# Patient Record
Sex: Female | Born: 1985 | Race: White | Hispanic: No | Marital: Married | Smoking: Never smoker
Health system: Southern US, Community
[De-identification: ages and names within clinical notes are randomized; demographics above are authoritative.]

## PROBLEM LIST (undated history)

## (undated) ENCOUNTER — Emergency Department: Admission: EM | Payer: Medicaid Other | Source: Home / Self Care

## (undated) DIAGNOSIS — D126 Benign neoplasm of colon, unspecified: Secondary | ICD-10-CM

## (undated) DIAGNOSIS — E039 Hypothyroidism, unspecified: Secondary | ICD-10-CM

## (undated) DIAGNOSIS — D1391 Familial adenomatous polyposis: Secondary | ICD-10-CM

## (undated) HISTORY — PX: OTHER SURGICAL HISTORY: SHX169

---
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
## 2008-07-01 HISTORY — PX: MANDIBLE FRACTURE SURGERY: SHX706

## 2009-07-01 HISTORY — PX: TOTAL THYROIDECTOMY: SHX2547

## 2012-07-01 HISTORY — PX: OVARIAN CYST REMOVAL: SHX89

## 2013-07-01 HISTORY — PX: ECTOPIC PREGNANCY SURGERY: SHX613

## 2016-10-27 ENCOUNTER — Observation Stay
Admission: EM | Admit: 2016-10-27 | Discharge: 2016-10-27 | Disposition: A | Discharge status: Home / Self Care | Payer: Medicaid Other | Attending: Obstetrics & Gynecology | Admitting: Obstetrics & Gynecology

## 2016-10-27 ENCOUNTER — Observation Stay: Payer: Medicaid Other

## 2016-10-27 DIAGNOSIS — O26893 Other specified pregnancy related conditions, third trimester: Principal | ICD-10-CM | POA: Insufficient documentation

## 2016-10-27 DIAGNOSIS — O99283 Endocrine, nutritional and metabolic diseases complicating pregnancy, third trimester: Secondary | ICD-10-CM | POA: Insufficient documentation

## 2016-10-27 DIAGNOSIS — Z79899 Other long term (current) drug therapy: Secondary | ICD-10-CM | POA: Insufficient documentation

## 2016-10-27 DIAGNOSIS — Z3A31 31 weeks gestation of pregnancy: Secondary | ICD-10-CM | POA: Insufficient documentation

## 2016-10-27 DIAGNOSIS — E039 Hypothyroidism, unspecified: Secondary | ICD-10-CM | POA: Insufficient documentation

## 2016-10-27 DIAGNOSIS — R112 Nausea with vomiting, unspecified: Secondary | ICD-10-CM | POA: Insufficient documentation

## 2016-10-27 DIAGNOSIS — R1013 Epigastric pain: Secondary | ICD-10-CM | POA: Insufficient documentation

## 2016-10-27 DIAGNOSIS — R109 Unspecified abdominal pain: Secondary | ICD-10-CM | POA: Diagnosis present

## 2016-10-27 DIAGNOSIS — Z3490 Encounter for supervision of normal pregnancy, unspecified, unspecified trimester: Secondary | ICD-10-CM

## 2016-10-27 HISTORY — DX: Familial adenomatous polyposis: D13.91

## 2016-10-27 HISTORY — DX: Benign neoplasm of colon, unspecified: D12.6

## 2016-10-27 HISTORY — DX: Hypothyroidism, unspecified: E03.9

## 2016-10-27 LAB — COMPREHENSIVE METABOLIC PANEL
ALT: 17 U/L (ref 14–54)
ANION GAP: 7 (ref 5–15)
AST: 22 U/L (ref 15–41)
Albumin: 3 g/dL — ABNORMAL LOW (ref 3.5–5.0)
Alkaline Phosphatase: 80 U/L (ref 38–126)
BUN: 8 mg/dL (ref 6–20)
CALCIUM: 7.9 mg/dL — AB (ref 8.9–10.3)
CHLORIDE: 106 mmol/L (ref 101–111)
CO2: 25 mmol/L (ref 22–32)
Creatinine, Ser: 0.5 mg/dL (ref 0.44–1.00)
GFR calc non Af Amer: >60 mL/min (ref >60)
Glucose, Bld: 91 mg/dL (ref 65–99)
Potassium: 3.6 mmol/L (ref 3.5–5.1)
SODIUM: 138 mmol/L (ref 135–145)
Total Bilirubin: 0.4 mg/dL (ref 0.3–1.2)
Total Protein: 6 g/dL — ABNORMAL LOW (ref 6.5–8.1)

## 2016-10-27 LAB — CBC
HCT: 34.8 % — ABNORMAL LOW (ref 35.0–47.0)
Hemoglobin: 12.5 g/dL (ref 12.0–16.0)
MCH: 32.4 pg (ref 26.0–34.0)
MCHC: 35.8 g/dL (ref 32.0–36.0)
MCV: 90.5 fL (ref 80.0–100.0)
PLATELETS: 167 10*3/uL (ref 150–440)
RBC: 3.85 MIL/uL (ref 3.80–5.20)
RDW: 12.2 % (ref 11.5–14.5)
WBC: 10.6 10*3/uL (ref 3.6–11.0)

## 2016-10-27 LAB — LIPASE, BLOOD: Lipase: 22 U/L (ref 11–51)

## 2016-10-27 MED ORDER — DIPHENHYDRAMINE HCL 25 MG PO CAPS
25.0000 mg | ORAL_CAPSULE | Freq: Once | ORAL | Status: AC | PRN
  Administered 2016-10-27: 25 mg via ORAL
  Filled 2016-10-27: qty 1

## 2016-10-27 MED ORDER — LACTATED RINGERS IV SOLN
INTRAVENOUS | Status: DC
  Administered 2016-10-27: 06:00:00 via INTRAVENOUS

## 2016-10-27 MED ORDER — PROMETHAZINE HCL 25 MG/ML IJ SOLN: 25.0000 mg | Freq: Four times a day (QID) | INTRAMUSCULAR | Status: DC | PRN

## 2016-10-27 MED ORDER — ONDANSETRON HCL 4 MG/2ML IJ SOLN: 4.0000 mg | INTRAMUSCULAR | Status: DC | PRN

## 2016-10-27 MED ORDER — LACTATED RINGERS IV BOLUS (SEPSIS)
500.0000 mL | Freq: Once | INTRAVENOUS | Status: AC
  Administered 2016-10-27: 500 mL via INTRAVENOUS

## 2016-10-27 NOTE — Progress Notes (Signed)
Patient symptoms have resolved after benadryl and fluids, order to d/c home if able to keep down po fluid and medication. Patient states she is no longer nauseated and is feeling much better. Discharge instructions provided and gone over with husband and patient, verbalized understanding.

## 2016-10-27 NOTE — OB Triage Note (Signed)
Patient arrived in triage with c/o waiking up around 0200 with n/v, sharp abdominal pains, and constant lower back pain.  Patient states the abdominal pain does not feel like contractions, it is constant and sharp. Has had 3 episodes of emesis today. Denies diarrhea or any urinary symptoms. States positive fetal movement, denies leaking of fluid and vaginal bleeding.  Patient receives prenatal care at [facility], but was visiting with family closer to [facility].  States no other problems this pregnancy.  Abdomen palpates soft with noticeable tenderness to palpation of mid, upper abdomen. EFM applied. Discussed plan of care with patient and significant other who verbalized understanding.

## 2016-10-27 NOTE — Discharge Instructions (Signed)
Return to hospital or follow up with primary care provider if symptoms return/worsen.  May take tums as per package instructions along with prevacid for heartburn/reflux.

## 2016-11-01 NOTE — Discharge Summary (Signed)
Pamela Griffin is a 31 y.o. female. She is at [redacted]w[redacted]d gestation. Patient's last menstrual period was 03/29/2016 (exact date). Estimated Date of Delivery: 01/03/17  Prenatal care site: [facility]  Chief complaint: abdominal pain  Location:  abdomen and back Onset/timing: sudden  Duration: an hour Quality: sharp intermittent pains Severity: 8/10 Aggravating or alleviating conditions: benadryl Associated signs/symptoms: +nausea +vomiting, benadryl helps with N/V typically.  no CTX, no VB.no LOF,  Active fetal movement. Context: Pamela Griffin usually gets her care at [facility] Has pregnancy issues of hypothyroid, notable N/V with alleviation with H2 blockers.  She woke suddenlyt this AM and had some emesis.  No recent illnesses, no sick contacts.  No blood.    Maternal Medical History:   Past Medical History:  Diagnosis Date  . FAP (familial adenomatous polyposis)   . Hypothyroidism     Past Surgical History:  Procedure Laterality Date  . ECTOPIC PREGNANCY SURGERY  2015  . HAND SURGERY  2003  . MANDIBLE FRACTURE SURGERY  2010  . OVARIAN CYST REMOVAL  2014  . TOTAL THYROIDECTOMY  2011    No Known Allergies  Prior to Admission medications   Medication Sig Start Date End Date Taking? Authorizing Provider  levothyroxine (SYNTHROID, LEVOTHROID) 100 MCG tablet Take 100 mcg by mouth daily before breakfast.   Yes Historical Provider, MD  Prenatal Vit-Fe Fumarate-FA (PRENATAL MULTIVITAMIN) TABS tablet Take 1 tablet by mouth daily at 12 noon.   Yes Historical Provider, MD     Social History: She  reports that she has never smoked. She has never used smokeless tobacco. She reports that she does not drink alcohol or use drugs.  Family History: familial adenomatous polyposis, Gardner syndrome  Review of Systems: A full review of systems was performed and negative except as noted in the HPI.     O:  BP (!) 101/47 (BP Location: Right Arm)   Pulse 64   Temp 97.8 F (36.6 C) (Oral)   Resp 16   Ht 5\' 2"   (1.575 m)   Wt 65.8 kg (145 lb)   LMP 03/29/2016 (Exact Date)   SpO2 98%   BMI 26.52 kg/m  No results found for this or any previous visit (from the past 48 hour(s)).   Constitutional: NAD, AAOx3  HE/ENT: extraocular movements grossly intact, moist mucous membranes CV: RRR PULM: nl respiratory effort, CTABL     Abd: gravid, non-tender, non-distended, soft      Ext: Non-tender, Nonedmeatous   Psych: mood appropriate, speech normal Pelvic deferred  NST:  Baseline: 135 Variability: moderate Accelerations present x >2 Decelerations absent Time 4mins  Sodium 135 - 145 mmol/L 138   Potassium 3.5 - 5.1 mmol/L 3.6   Chloride 101 - 111 mmol/L 106   CO2 22 - 32 mmol/L 25   Glucose, Bld 65 - 99 mg/dL 91   BUN 6 - 20 mg/dL 8   Creatinine, Ser 0.44 - 1.00 mg/dL 0.50   Calcium 8.9 - 10.3 mg/dL 7.9    Total Protein 6.5 - 8.1 g/dL 6.0    Albumin 3.5 - 5.0 g/dL 3.0    AST 15 - 41 U/L 22   ALT 14 - 54 U/L 17   Alkaline Phosphatase 38 - 126 U/L 80   Total Bilirubin 0.3 - 1.2 mg/dL 0.4   GFR calc non Af Amer >60 mL/min >60   GFR calc Af Amer >60 mL/min >60    WBC 3.6 - 11.0 K/uL 10.6   RBC 3.80 - 5.20 MIL/uL 3.85  Hemoglobin 12.0 - 16.0 g/dL 12.5   HCT 35.0 - 47.0 % 34.8    MCV 80.0 - 100.0 fL 90.5   MCH 26.0 - 34.0 pg 32.4   MCHC 32.0 - 36.0 g/dL 35.8   RDW 11.5 - 14.5 % 12.2   Platelets 150 - 440 K/uL 167    CLINICAL DATA:  Epigastric pain with nausea and vomiting for 5 hours.  EXAM: US ABDOMEN LIMITED - RIGHT UPPER QUADRANT  COMPARISON:  None.  FINDINGS: Gallbladder:  No gallstones or wall thickening visualized. No sonographic Murphy sign noted by sonographer.  Common bile duct:  Diameter: 3 mm  Liver:  No focal lesion identified. Within normal limits in parenchymal echogenicity.  IMPRESSION: Negative RIGHT upper quadrant ultrasound.   Electronically Signed   By: Pamela Griffin M.D.   On: 10/27/2016 05:27   A/P: 31 y.o. [redacted]w[redacted]d here for  GI distress  Labor: not present.   Fetal Wellbeing: Reassuring Cat 1 tracing.  Labs unremarkable, ultrasound negative.  Requested bendadryl which provided relief.    Reactive NST   D/c home stable, precautions reviewed, follow-up as scheduled.   ----- Larey Days, MD Attending Obstetrician and Gynecologist [facility], Department of [facility]

## 2017-05-05 ENCOUNTER — Encounter (HOSPITAL_COMMUNITY): Payer: Self-pay

## 2018-09-02 IMAGING — US US ABDOMEN LIMITED
1 series · 14 of 25 positions shown · non-contrast
Comparison: None.

CLINICAL DATA: Epigastric pain with nausea and vomiting for 5
hours.

EXAM:
US ABDOMEN LIMITED - RIGHT UPPER QUADRANT

[Series 1: us abdomen limited · 0.21mm/px · 14 of 36 slices shown]
[im 1/36]
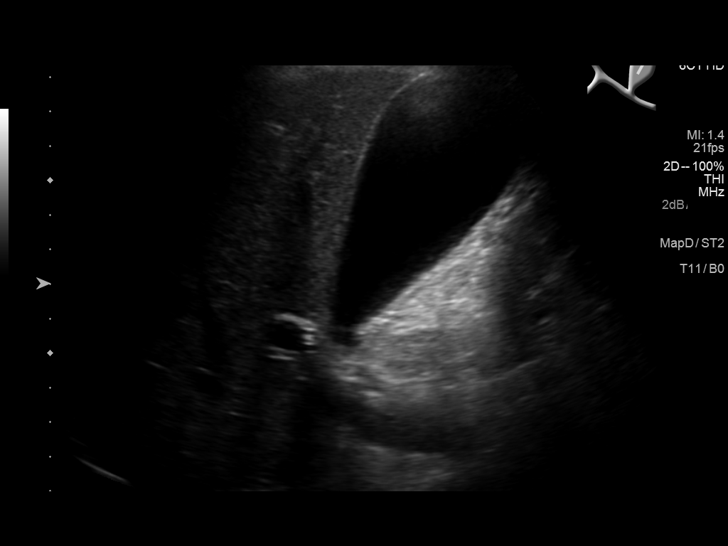
[im 3/36]
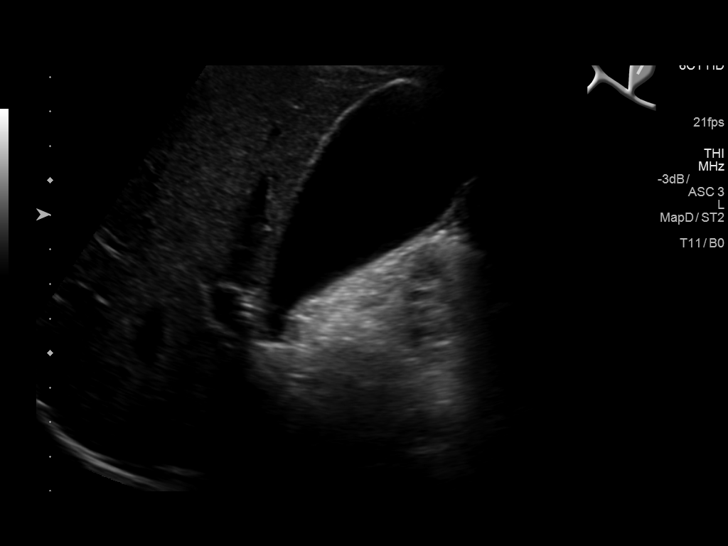
[im 6/36]
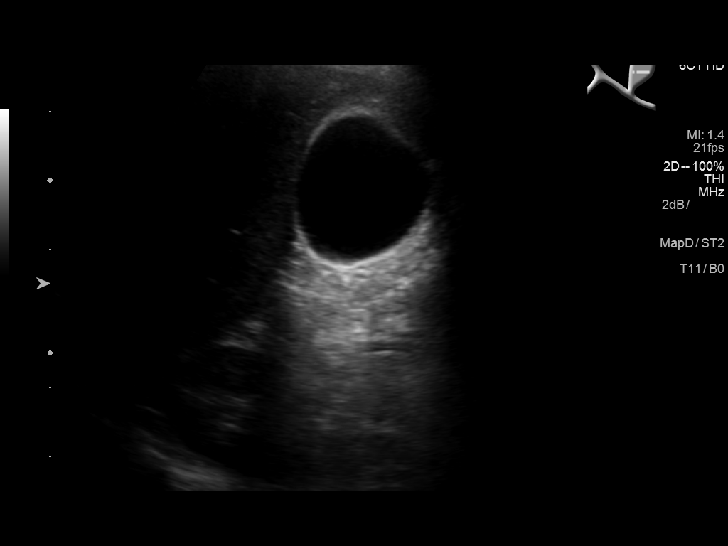
[im 9/36]
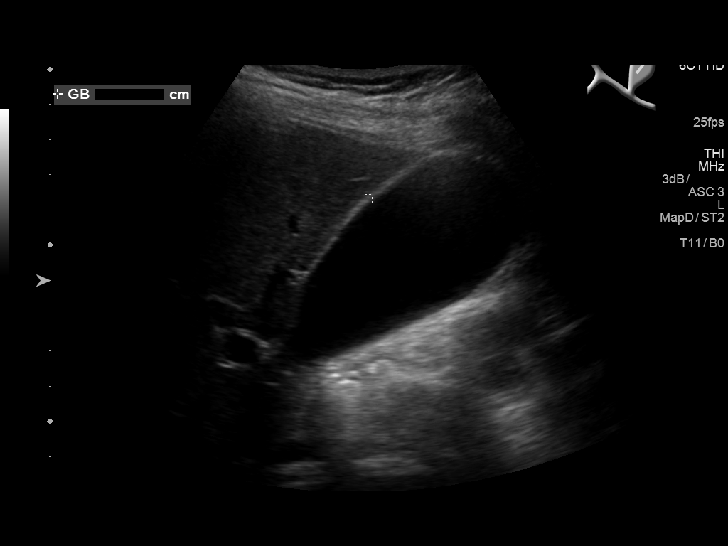
[im 12/36]
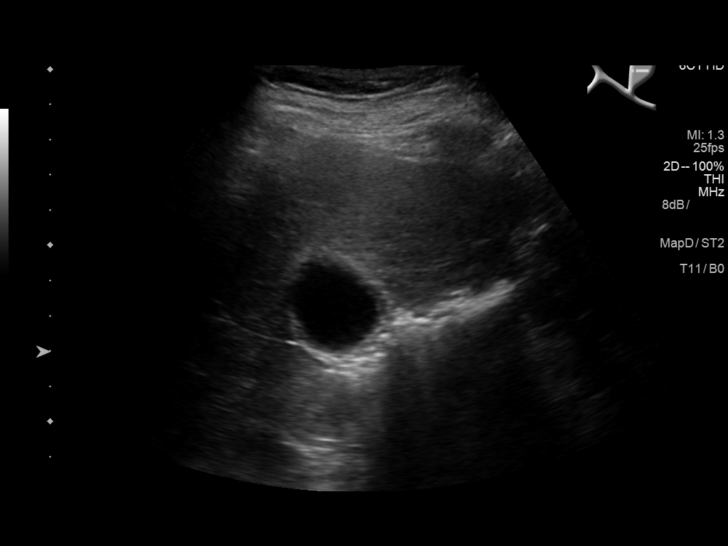
[im 14/36]
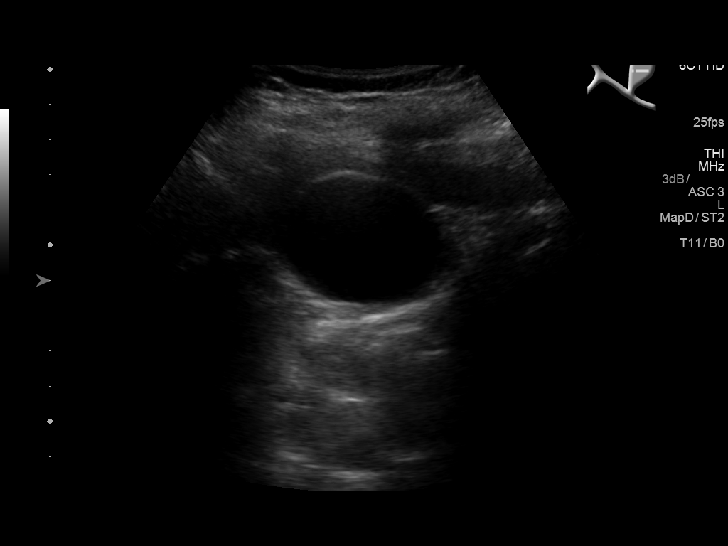
[im 17/36]
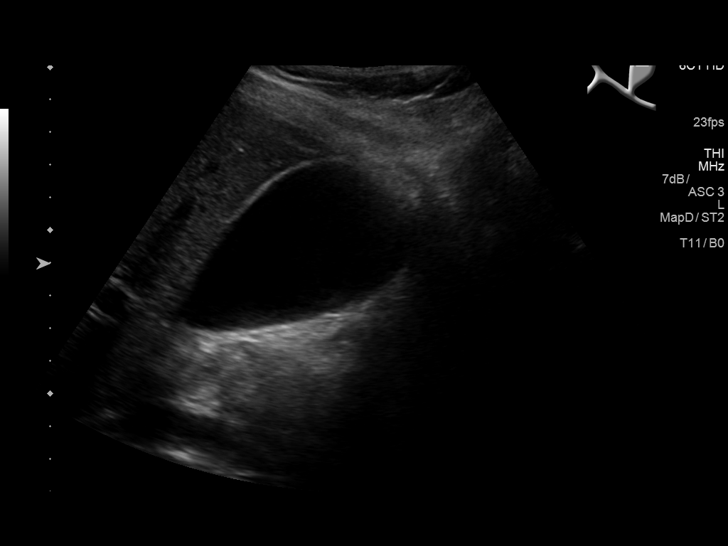
[im 19/36]
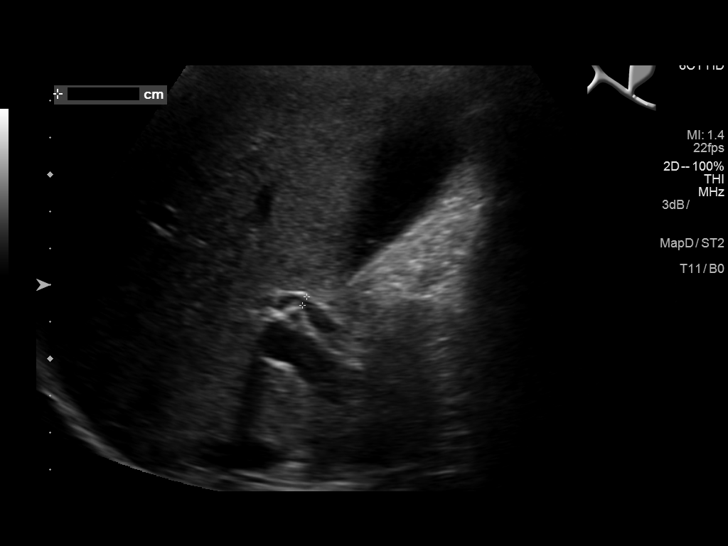
[im 22/36]
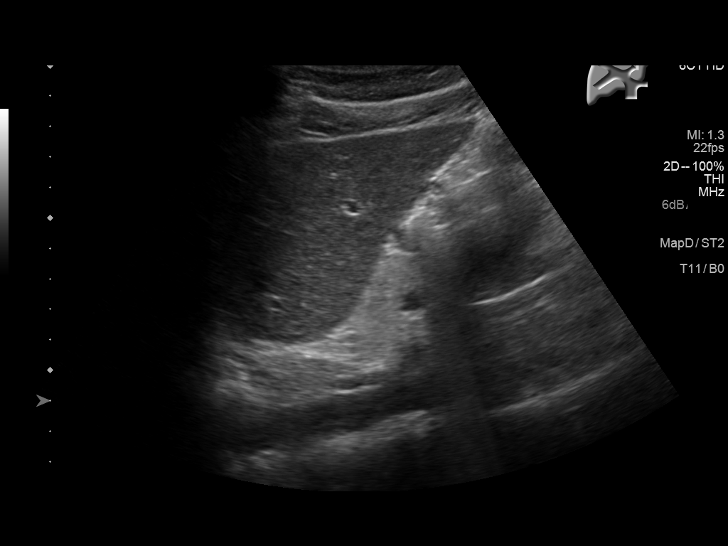
[im 24/36]
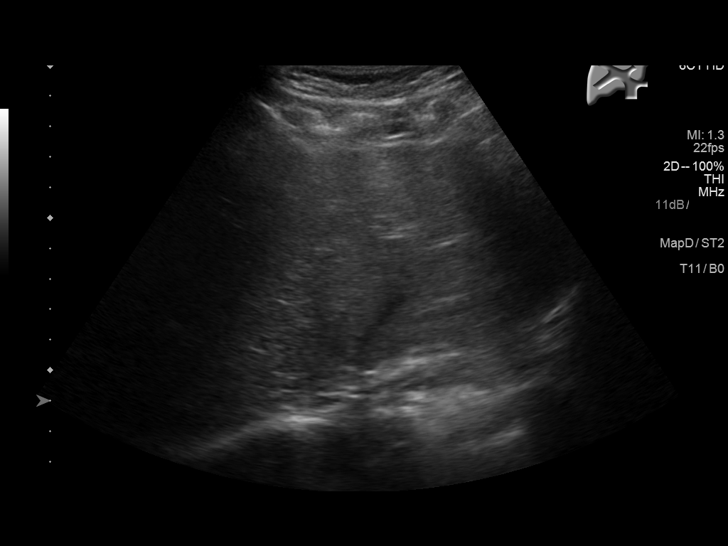
[im 27/36]
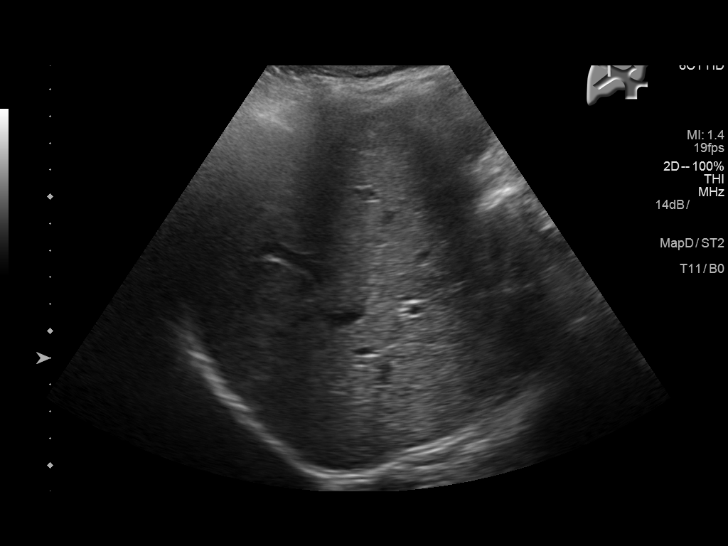
[im 30/36]
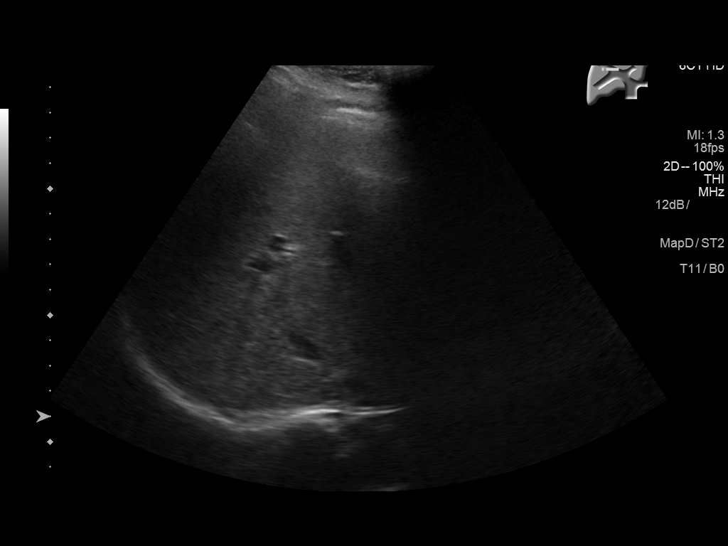
[im 33/36]
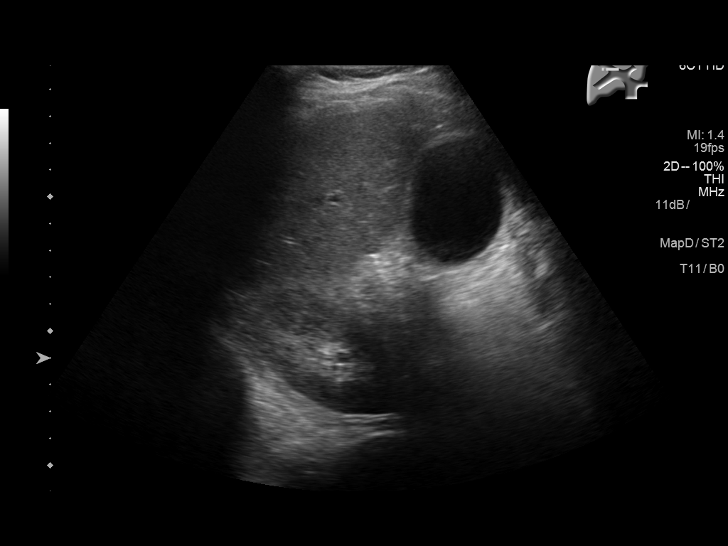
[im 36/36]
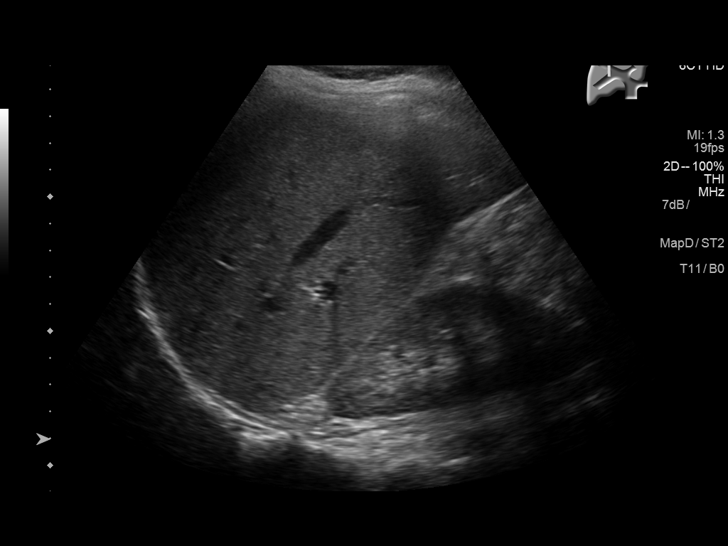

[14 of 25 positions shown; findings below may reference images not displayed]

FINDINGS: Gallbladder:

No gallstones or wall thickening visualized. No sonographic Murphy
sign noted by sonographer.

Common bile duct:

Diameter: 3 mm

Liver:

No focal lesion identified. Within normal limits in parenchymal
echogenicity.
IMPRESSION: Negative RIGHT upper quadrant ultrasound.

## 2019-05-15 ENCOUNTER — Emergency Department: Payer: Medicaid Other

## 2019-05-15 ENCOUNTER — Other Ambulatory Visit: Payer: Self-pay

## 2019-05-15 ENCOUNTER — Emergency Department
Admission: EM | Admit: 2019-05-15 | Discharge: 2019-05-16 | Disposition: A | Discharge status: Home / Self Care | Payer: Medicaid Other | Attending: Emergency Medicine | Admitting: Emergency Medicine

## 2019-05-15 DIAGNOSIS — U071 COVID-19: Secondary | ICD-10-CM | POA: Insufficient documentation

## 2019-05-15 DIAGNOSIS — E890 Postprocedural hypothyroidism: Secondary | ICD-10-CM | POA: Insufficient documentation

## 2019-05-15 DIAGNOSIS — J168 Pneumonia due to other specified infectious organisms: Secondary | ICD-10-CM | POA: Diagnosis not present

## 2019-05-15 DIAGNOSIS — R079 Chest pain, unspecified: Secondary | ICD-10-CM | POA: Insufficient documentation

## 2019-05-15 LAB — BASIC METABOLIC PANEL
Anion gap: 10 (ref 5–15)
BUN: 13 mg/dL (ref 6–20)
CO2: 25 mmol/L (ref 22–32)
Calcium: 8.4 mg/dL — ABNORMAL LOW (ref 8.9–10.3)
Chloride: 104 mmol/L (ref 98–111)
Creatinine, Ser: 0.69 mg/dL (ref 0.44–1.00)
GFR calc Af Amer: >60 mL/min (ref >60)
GFR calc non Af Amer: >60 mL/min (ref >60)
Glucose, Bld: 91 mg/dL (ref 70–99)
Potassium: 3.5 mmol/L (ref 3.5–5.1)
Sodium: 139 mmol/L (ref 135–145)

## 2019-05-15 LAB — CBC
HCT: 39.5 % (ref 36.0–46.0)
Hemoglobin: 13.2 g/dL (ref 12.0–15.0)
MCH: 29.5 pg (ref 26.0–34.0)
MCHC: 33.4 g/dL (ref 30.0–36.0)
MCV: 88.4 fL (ref 80.0–100.0)
Platelets: 206 10*3/uL (ref 150–400)
RBC: 4.47 MIL/uL (ref 3.87–5.11)
RDW: 12 % (ref 11.5–15.5)
WBC: 4.1 10*3/uL (ref 4.0–10.5)
nRBC: 0 % (ref 0.0–0.2)

## 2019-05-15 LAB — TROPONIN I (HIGH SENSITIVITY): Troponin I (High Sensitivity): 3 ng/L (ref <18)

## 2019-05-15 NOTE — ED Triage Notes (Signed)
Pt to the er for chest pain and SOB. Pt tested for covid today but no results yet. Pt says the chest pain started today. Mom tested positive for covid.

## 2019-05-16 ENCOUNTER — Emergency Department
Admission: EM | Admit: 2019-05-16 | Discharge: 2019-05-16 | Disposition: A | Discharge status: Home / Self Care | Payer: Medicaid Other | Source: Home / Self Care | Attending: Emergency Medicine | Admitting: Emergency Medicine

## 2019-05-16 ENCOUNTER — Other Ambulatory Visit: Payer: Self-pay

## 2019-05-16 ENCOUNTER — Encounter: Payer: Self-pay | Admitting: Emergency Medicine

## 2019-05-16 DIAGNOSIS — E890 Postprocedural hypothyroidism: Secondary | ICD-10-CM | POA: Insufficient documentation

## 2019-05-16 DIAGNOSIS — U071 COVID-19: Secondary | ICD-10-CM | POA: Insufficient documentation

## 2019-05-16 DIAGNOSIS — J168 Pneumonia due to other specified infectious organisms: Secondary | ICD-10-CM | POA: Insufficient documentation

## 2019-05-16 MED ORDER — HYDROCOD POLST-CPM POLST ER 10-8 MG/5ML PO SUER: 5.0000 mL | Freq: Two times a day (BID) | ORAL | 0 refills | Status: DC

## 2019-05-16 NOTE — ED Provider Notes (Signed)
[facility] Emergency Department Provider Note       Time seen: ----------------------------------------- 7:47 AM on 05/16/2019 -----------------------------------------   I have reviewed the triage vital signs and the nursing notes.  HISTORY   Chief Complaint Chest Pain, Back Pain, and Nausea    HPI Pamela Griffin is a 33 y.o. female with a history of familial adenomatous polyposis, hypothyroidism who presents to the ED for COVID-19 symptoms.  Patient's mom tested positive this past week.  She reportedly has had a negative outpatient test.  She has had some chest pain and mid back pain since 10 PM last night.  Dry cough, no fever.  Past Medical History:  Diagnosis Date  . FAP (familial adenomatous polyposis)   . Hypothyroidism     Patient Active Problem List   Diagnosis Date Noted  . Pregnancy 10/27/2016    Past Surgical History:  Procedure Laterality Date  . ECTOPIC PREGNANCY SURGERY  2015  . HAND SURGERY  2003  . MANDIBLE FRACTURE SURGERY  2010  . OVARIAN CYST REMOVAL  2014  . TOTAL THYROIDECTOMY  2011    Allergies Patient has no known allergies.  Social History Social History   Tobacco Use  . Smoking status: Never Smoker  . Smokeless tobacco: Never Used  Substance Use Topics  . Alcohol use: No  . Drug use: No    Review of Systems Constitutional: Negative for fever. Cardiovascular: Negative for chest pain. Respiratory: Negative for shortness of breath.  Positive for cough Gastrointestinal: Negative for abdominal pain, positive for nausea Musculoskeletal: Positive for back pain Skin: Negative for rash. Neurological: Negative for headaches, focal weakness or numbness.  All systems negative/normal/unremarkable except as stated in the HPI  ____________________________________________   PHYSICAL EXAM:  VITAL SIGNS: ED Triage Vitals  Enc Vitals Group     BP 05/16/19 0324 118/79     Pulse Rate 05/16/19 0324 91     Resp  05/16/19 0324 17     Temp 05/16/19 0324 98.1 F (36.7 C)     Temp Source 05/16/19 0324 Oral     SpO2 05/16/19 0324 100 %     Weight 05/16/19 0319 134 lb 14.7 oz (61.2 kg)     Height 05/16/19 0319 5\' 2"  (1.575 m)     Head Circumference --      Peak Flow --      Pain Score 05/16/19 0318 7     Pain Loc --      Pain Edu? --      Excl. in Chloride? --    Constitutional: Alert and oriented. Well appearing and in no distress. Eyes: Conjunctivae are normal. Normal extraocular movements. Cardiovascular: Normal rate, regular rhythm. No murmurs, rubs, or gallops. Respiratory: Normal respiratory effort without tachypnea nor retractions. Breath sounds are clear and equal bilaterally. No wheezes/rales/rhonchi. Gastrointestinal: Soft and nontender. Normal bowel sounds Musculoskeletal: Nontender with normal range of motion in extremities. No lower extremity tenderness nor edema. Neurologic:  Normal speech and language. No gross focal neurologic deficits are appreciated.  Skin:  Skin is warm, dry and intact. No rash noted. Psychiatric: Mood and affect are normal. Speech and behavior are normal.  ____________________________________________  EKG: Interpreted by me.  Sinus rhythm rate of 79 bpm, normal PR interval, normal QRS, normal QT  ____________________________________________  ED COURSE:  As part of my medical decision making, I reviewed the following data within the [place] History obtained from family if available, nursing notes, old chart and ekg, as  well as notes from prior ED visits. Patient presented for COVID-19 symptoms, we will assess with labs and imaging as indicated at this time.   Procedures  Luria Sondergaard was evaluated in Emergency Department on 05/16/2019 for the symptoms described in the history of present illness. She was evaluated in the context of the global COVID-19 pandemic, which necessitated consideration that the patient might be at risk for infection with  the SARS-CoV-2 virus that causes COVID-19. Institutional protocols and algorithms that pertain to the evaluation of patients at risk for COVID-19 are in a state of rapid change based on information released by regulatory bodies including the CDC and federal and state organizations. These policies and algorithms were followed during the patient's care in the ED.  ____________________________________________   LABS (pertinent positives/negatives)  Labs yesterday were unremarkable  RADIOLOGY Images were viewed by me  Chest x-ray was reassuring  ____________________________________________   DIFFERENTIAL DIAGNOSIS   COVID-19, viral infection, pneumonia  FINAL ASSESSMENT AND PLAN  COVID-19   Plan: The patient had presented for symptoms of COVID-19.  Patient has mild symptoms at this time and I think will do very well.  She will be prescribed cough medicine.  She is cleared for outpatient follow-up.   Laurence Aly, MD    Note: This note was generated in part or whole with voice recognition software. Voice recognition is usually quite accurate but there are transcription errors that can and very often do occur. I apologize for any typographical errors that were not detected and corrected.     Earleen Newport, MD 05/16/19 [phone]

## 2019-05-16 NOTE — ED Notes (Signed)
Pt waiting patiently for treatment room

## 2019-05-16 NOTE — ED Triage Notes (Addendum)
Pt here earlier in the ED waiting room and left without being seen; thought she was feeling better but alas, she is not; pt reports chest pain and mid left back pain since 10pm last night; pt's mother has tested positive for COVID and pt has been exposed; dry cough; denies fever; some shortness of breath; pt talking in complete coherent sentences; pt says she lost her sense of taste today;

## 2019-05-16 NOTE — ED Notes (Signed)
Pt c/o of SHOB and chest pain with nausea and lightheaded- no fever or diarrhea- pt's mom tested positive for COVID on Friday

## 2020-08-11 IMAGING — CR DG CHEST 2V
1 series · 2 of 2 positions shown · non-contrast
Comparison: None.

CLINICAL DATA: Chest pain

EXAM:
CHEST - 2 VIEW

[Series 1: dg chest 2 view · 0.14mm/px · 2 of 2 slices shown]
[im 1/2]
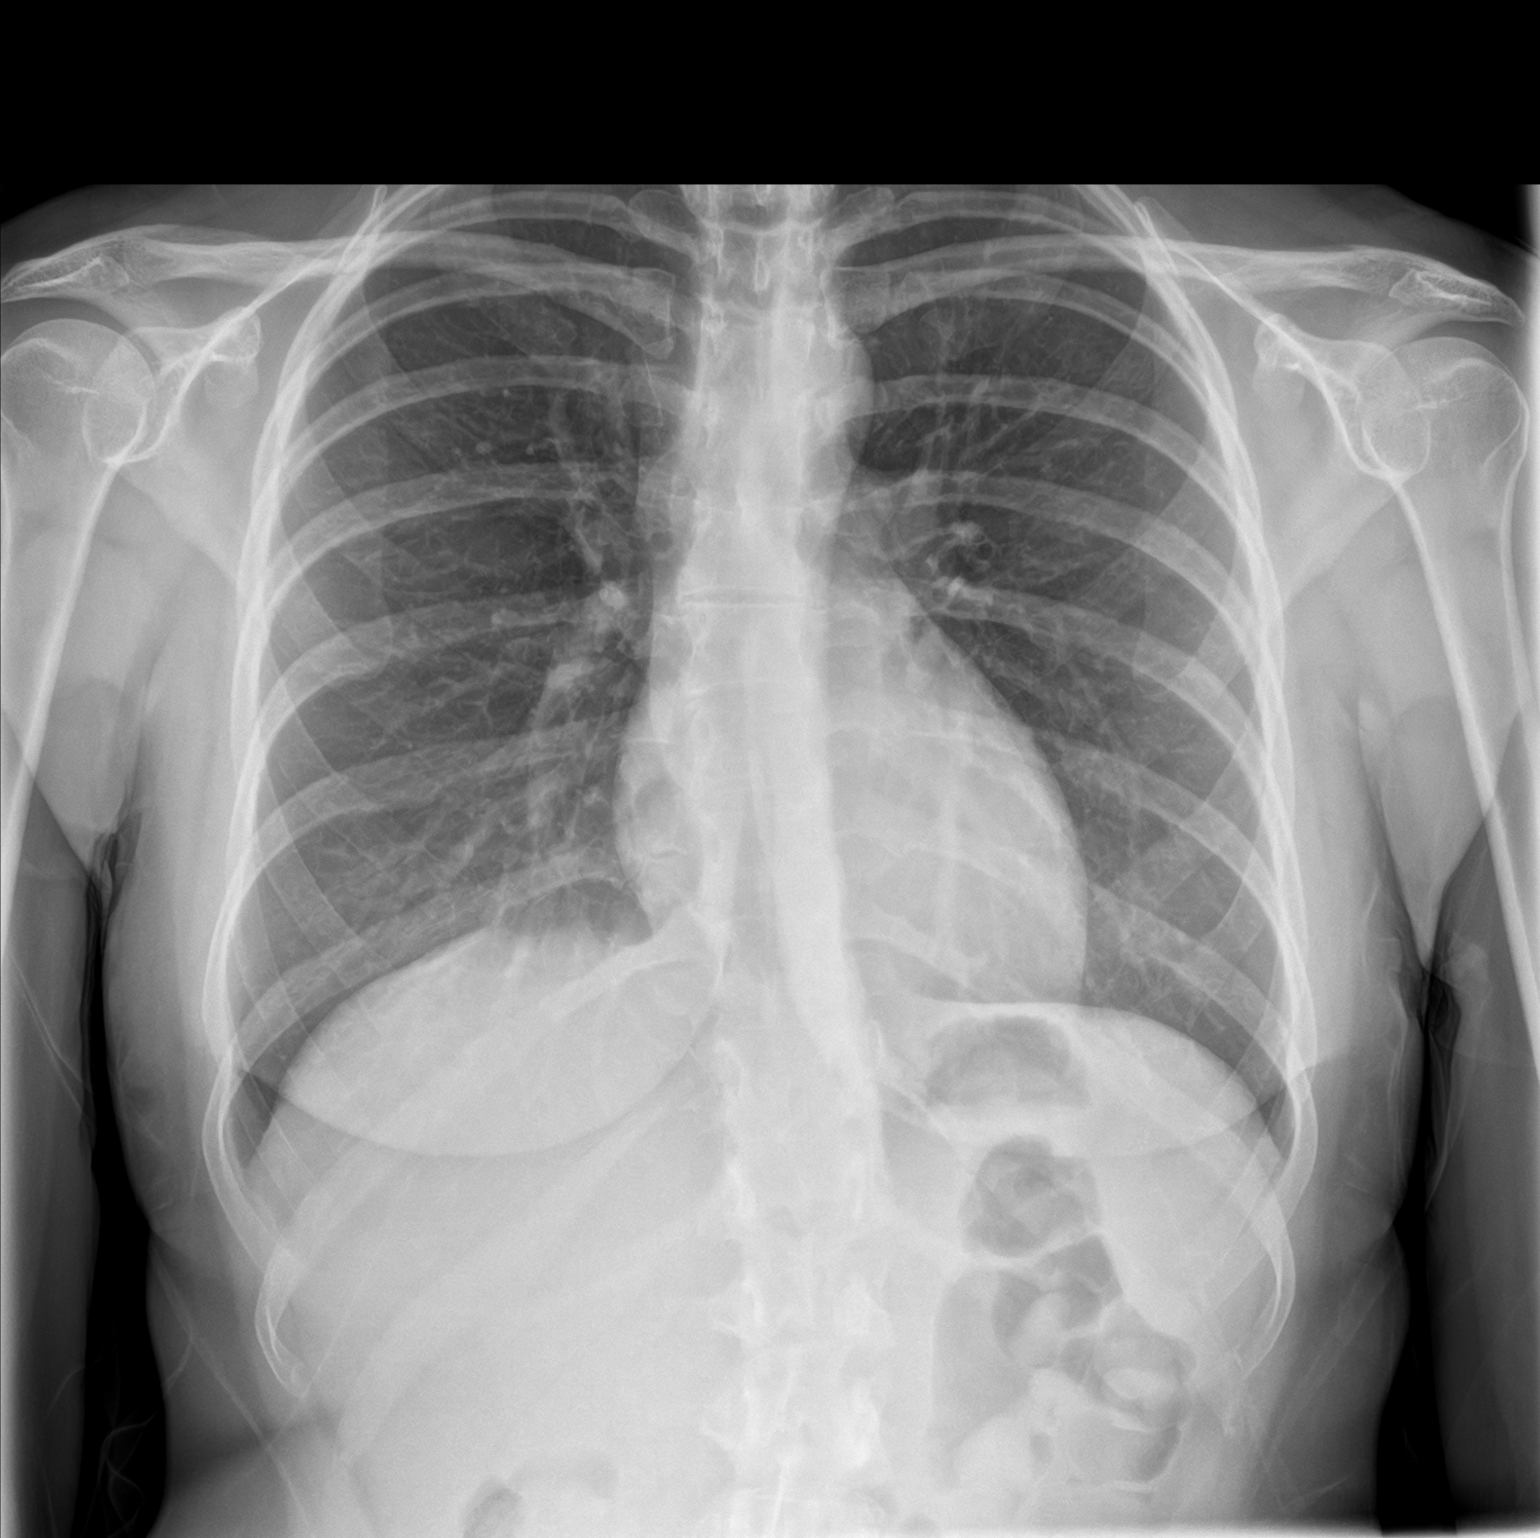
[im 2/2]
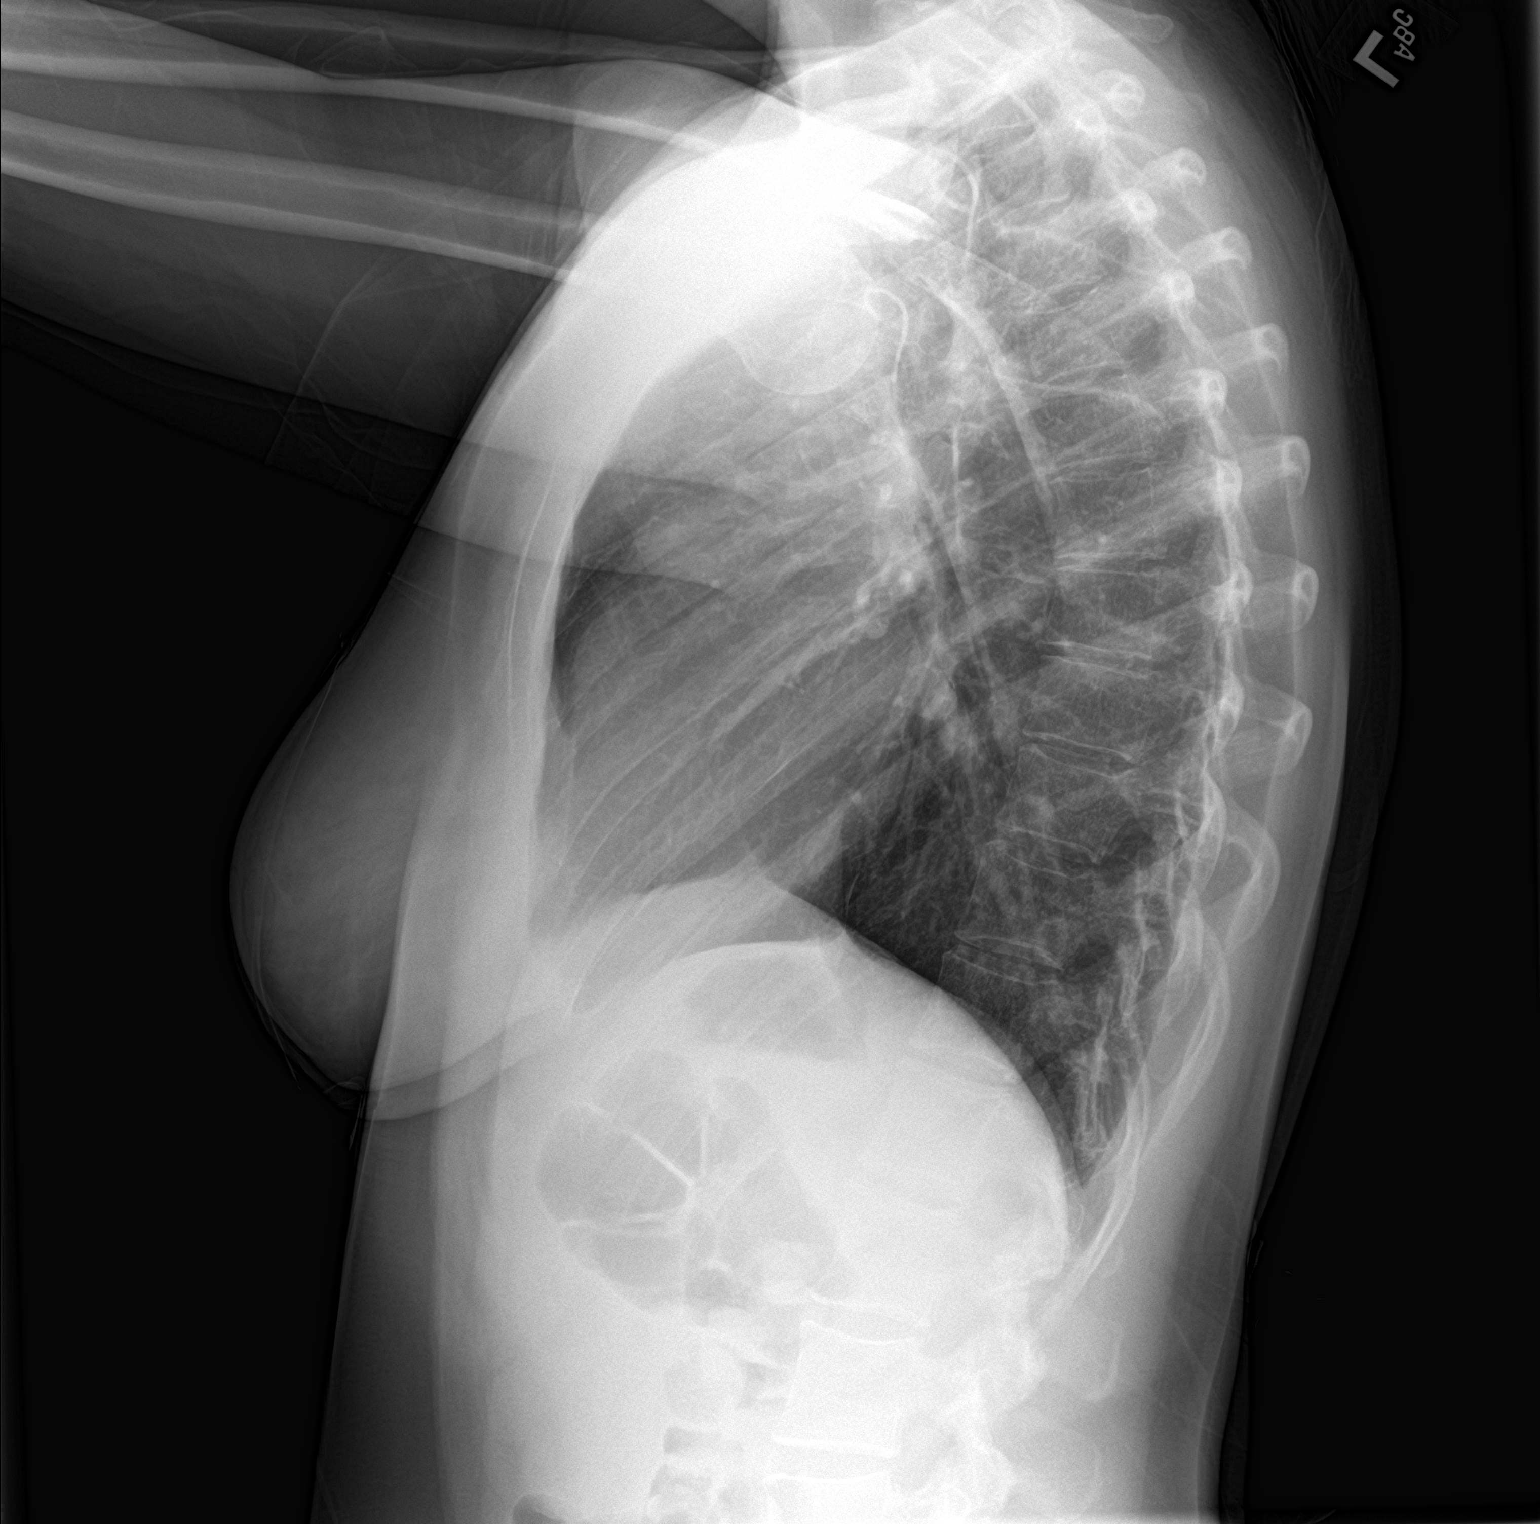

[2 of 2 positions shown; findings below may reference images not displayed]

FINDINGS: The heart size and mediastinal contours are within normal limits.
Both lungs are clear. Mild scoliosis.
IMPRESSION: No active cardiopulmonary disease.

## 2020-12-13 ENCOUNTER — Ambulatory Visit: Payer: Medicaid Other | Attending: Gerontology | Admitting: Physical Therapy

## 2020-12-13 ENCOUNTER — Other Ambulatory Visit: Payer: Self-pay

## 2020-12-13 ENCOUNTER — Encounter: Payer: Self-pay | Admitting: Physical Therapy

## 2020-12-13 DIAGNOSIS — R278 Other lack of coordination: Secondary | ICD-10-CM | POA: Diagnosis present

## 2020-12-13 DIAGNOSIS — M6281 Muscle weakness (generalized): Secondary | ICD-10-CM | POA: Insufficient documentation

## 2020-12-13 NOTE — Therapy (Signed)
[facility] [address] [place], [place], [address] Phone: [phone]   Fax:  [phone]  Physical Therapy Evaluation  Patient Details  Name: Pamela Griffin MRN: [id number] Date of Birth: 10/28/1985 Referring Provider (PT): Wynelle Cleveland   Encounter Date: 12/13/2020   PT End of Session - 12/13/20 0940     Visit Number 1    Number of Visits 12    Date for PT Re-Evaluation 03/07/21    Authorization Type IE 12/13/2020    PT Start Time 0930    PT Stop Time 1010    PT Time Calculation (min) 40 min    Activity Tolerance Patient tolerated treatment well    Behavior During Therapy [facility] for tasks assessed/performed             Past Medical History:  Diagnosis Date   FAP (familial adenomatous polyposis)    Hypothyroidism     Past Surgical History:  Procedure Laterality Date   ECTOPIC PREGNANCY SURGERY  2015   HAND SURGERY  2003   MANDIBLE FRACTURE SURGERY  2010   OVARIAN CYST REMOVAL  2014   TOTAL THYROIDECTOMY  2011    There were no vitals filed for this visit.        [facility] PT Assessment - 12/13/20 0001       Assessment   Medical Diagnosis SUI    Referring Provider (PT) Wynelle Cleveland    Hand Dominance Right    Prior Therapy None for this dx      Balance Screen   Has the patient fallen in the past 6 months No             PELVIC HEALTH PHYSICAL THERAPY EVALUATION  SCREENING Red Flags: None Have you had any night sweats? Unexplained weight loss? Saddle anesthesia? Unexplained changes in bowel or bladder habits?  Precautions: None  SUBJECTIVE  Chief Complaint: Patient notes that she has UI with coughing/laughing/sneezing, yelling, jumping, impact exercises. Patient reports that this complaint has been going on since roughly 2014; but she notes that she does recollect having some issues with UI in high school.  Pertinent History:  Falls Negative.  Scoliosis Positive for continued  screening in adolescence. Pulmonary disease/dysfunction Negative. Surgical history: Positive for see above.   Recent Procedures/Tests/Findings: 07/06/2020 urology: UA neg PvR 12 mls  Obstetrical History: G5P3 Deliveries: vaginal Tearing/Episiotomy: P1, grade 3.  Birthing position: back  Gynecological History: Hysterectomy: No  Endometriosis: Negative Pelvic Organ Prolapse: Negative Last Menstrual Period:  Pain with exam: No Heaviness/pressure: No    Urinary History: Incontinence: Positive. Onset: 2014 Triggers: impact exercise, yelling, coughing, laughing, sneezing. Amount: Min/Mod. Protective undergarments: No   Fluid Intake: 1 gal H20, occasional caffeinated, 1x/week diet sodas Nocturia: 0x/night Frequency of urination: every 1 hour Toileting posture: tucked feet Pain with urination: Negative Difficulty initiating urination: Negative Intermittent stream: Negative Frequent UTI: Negative.   Sexual activity/pain: Pain with intercourse: Negative.   Initial penetration: No  Deep thrusting: No External stimulation: No  Location of pain: n/a  Current activities:  Mom; wedding cakes; exercise  OBJECTIVE  Mental Status Patient is oriented to person, place and time.  Recent memory is intact.  Remote memory is intact.  Attention span and concentration are intact.  Expressive speech is intact.  Patient's fund of knowledge is within normal limits for educational level.  POSTURE/OBSERVATIONS:  Lumbar lordosis: WNL Thoracic kyphosis: WNL Iliac crest height: equal bilaterally Lumbar lateral shift: negative Pelvic obliquity: negative Leg length discrepancy:  negative  GAIT: No gross deviations; WFL.  RANGE OF MOTION: deferred 2/2 to time constraints   LEFT RIGHT  Lumbar forward flexion (65):      Lumbar extension (30):     Lumbar lateral flexion (25):     Thoracic and Lumbar rotation (30 degrees):       Hip Flexion (0-125):      Hip IR (0-45):     Hip ER (0-45):      Hip Abduction (0-40):     Hip extension (0-15):       SENSATION: deferred 2/2 to time constraints  STRENGTH: MMT deferred 2/2 to time constraints  RLE LLE  Hip Flexion    Hip Extension    Hip Abduction     Hip Adduction     Hip ER     Hip IR     Knee Extension    Knee Flexion    Dorsiflexion     Plantarflexion (seated)     ABDOMINAL: deferred 2/2 to time constraints Palpation: Diastasis: Scar mobility: Rib flare:  SPECIAL TESTS: deferred 2/2 to time constraints   PHYSICAL PERFORMANCE MEASURES: STS: WNL   EXTERNAL PELVIC EXAM: deferred 2/2 to time constraints Palpation: Breath coordination: Voluntary Contraction: present/absent Relaxation: full/delayed/non-relaxing Perineal movement with sustained IAP increase ("bear down"): descent/no change/elevation/excessive descent Perineal movement with rapid IAP increase ("cough"): elevation/no change/descent  INTERNAL VAGINAL EXAM: deferred 2/2 to time constraints Introitus Appears:  Skin integrity:  Scar mobility: Strength (PERF):  Symmetry: Palpation: Prolapse:   INTERNAL RECTAL EXAM: not indicated Strength (PERF): Symmetry: Palpation: Prolapse:   OUTCOME MEASURES: FOTO (Urinary 55)   ASSESSMENT Patient is a 35 year old presenting to clinic with chief complaints of urinary incontinence with physical activity. Today's evaluation is suggestive of deficits in IAP management, PFM strength, and PFM coordination as evidenced by leakage with impact activities and those requiring increased intra-abdominal pressure (yelling, lifting, coughing). Patient denies any sensations of heaviness, pressure, or bulging which suggests that bladder control complaints are less likely to be associated with a significant pelvic organ support concern. Patient's responses on FOTO Urinary outcome measures (55) indicate moderate functional limitations/disability/distress. 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 Patient was able to achieve basic understanding of PFM functions and typical bladder functions during today's evaluation and responded positively to educational interventions. Patient will benefit from continued skilled therapeutic intervention to address deficits in IAP management, PFM strength, and PFM coordination in order to increase function and improve overall QOL.  EDUCATION Patient educated on prognosis, POC, and provided with HEP including: bladder diary. Patient articulated understanding and returned demonstration. Patient will benefit from further education in order to maximize compliance and understanding for long-term therapeutic gains.  TREATMENT Neuromuscular Re-education: Patient educated on primary functions of the pelvic floor including: posture/balance, sexual pleasure, storage and elimination of waste from the body, abdominal cavity closure, and breath coordination. Patient educated on extensively on typical bladder function, typical bladder habits, basics of urge suppression, bladder irritants, toileting posture, and bowel retraining in order to better regulate bowel and bladder through behavioral changes.        Objective measurements completed on examination: See above findings.                    PT Long Term Goals - 12/13/20 1629       PT LONG TERM GOAL #1   Title Patient will demonstrate independence with  HEP in order to maximize therapeutic gains and improve carryover from physical therapy sessions to ADLs in the home and community.    Baseline IE: not initiated    Time 12    Period Weeks    Status New    Target Date 03/07/21      PT LONG TERM GOAL #2   Title Patient will demonstrate circumferential and sequential contraction of >4/5 MMT, > 6 sec hold x10 and 5 consecutive quick flicks with </= 10 min rest between testing bouts, and relaxation of the PFM coordinated  with breath for improved management of intra-abdominal pressure and normal bowel and bladder function without the presence of pain nor incontinence in order to improve participation at home and in the community.    Baseline IE: assess at next visit    Time 12    Period Weeks    Status New    Target Date 03/07/21      PT LONG TERM GOAL #3   Title Patient will demonstrate improved function as evidenced by a score of 65 on FOTO measure for full participation in activities at home and in the community.    Baseline IE: 29    Time 12    Period Weeks    Status New    Target Date 03/07/21      PT LONG TERM GOAL #4   Title Patient will report "no difficulty" with participation in physical activities with respect to urinary problem for full participation in activities at home and in the community.    Baseline IE: "quite a bit of difficulty"    Time 12    Period Weeks    Status New    Target Date 03/07/21      PT LONG TERM GOAL #5   Title Patient will indicate "complete confidence" (or > 7/10) in her ability to control her bladder and prevent urinary leakage for improved QoL and participation.    Baseline IE: "moderate confidence"    Time 12    Period Weeks    Status New    Target Date 03/07/21                    Plan - 12/13/20 1608     Clinical Impression Statement Patient is a 35 year old presenting to clinic with chief complaints of urinary incontinence with physical activity. Today's evaluation is suggestive of deficits in IAP management, PFM strength, and PFM coordination as evidenced by leakage with impact activities and those requiring increased intra-abdominal pressure (yelling, lifting, coughing). Patient denies any sensations of heaviness, pressure, or bulging which suggests that bladder control complaints are less likely to be associated with a significant pelvic organ support concern. Patient's responses on FOTO Urinary outcome measures (55) indicate moderate functional  limitations/disability/distress. 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 Patient was able to achieve basic understanding of PFM functions and typical bladder functions during today's evaluation and responded positively to educational interventions. Patient will benefit from continued skilled therapeutic intervention to address deficits in IAP management, PFM strength, and PFM coordination in order to increase function and improve overall QOL.    Personal Factors and Comorbidities Behavior Pattern;Comorbidity 2;Past/Current Experience;Time since onset of injury/illness/exacerbation;Profession    Comorbidities hypothyroidism, familial adenomatous polyposis    Examination-Activity Limitations Transfers;Squat;Lift;Bend;Continence    Examination-Participation Restrictions Occupation;Cleaning;Laundry;Community Activity;Shop    Stability/Clinical Decision Making Evolving/Moderate complexity    Clinical Decision Making  Moderate    Rehab Potential Good    PT Frequency 1x / week    PT Duration 12 weeks    PT Treatment/Interventions ADLs/Self Care Home Management;Biofeedback;Cryotherapy;Electrical Stimulation;Moist Heat;Therapeutic exercise;Neuromuscular re-education;Patient/family education;Orthotic Fit/Training;Manual techniques;Scar mobilization;Taping;Joint Manipulations;Spinal Manipulations    PT Next Visit Plan physical assessment    PT Home Exercise Plan bladder diary; toileting posture    Consulted and Agree with Plan of Care Patient             Patient will benefit from skilled therapeutic intervention in order to improve the following deficits and impairments:  Improper body mechanics, Decreased activity tolerance, Decreased coordination, Decreased strength, Decreased endurance  Visit Diagnosis: Other lack of coordination  Muscle weakness (generalized)     Problem List Patient Active  Problem List   Diagnosis Date Noted   Pregnancy 10/27/2016   Myles Gip PT, DPT [phone]  12/13/2020, 4:31 PM  [facility] [address] [place], [place], [address] Phone: [phone]   Fax:  [phone]  Name: Gionni Freese MRN: [id number] Date of Birth: Feb 24, 1986

## 2020-12-19 ENCOUNTER — Ambulatory Visit: Payer: Medicaid Other | Admitting: Physical Therapy

## 2020-12-19 NOTE — Addendum Note (Signed)
Addended by: Louie Casa on: 12/19/2020 10:38 AM   Modules accepted: Orders

## 2020-12-20 ENCOUNTER — Ambulatory Visit: Payer: Medicaid Other | Admitting: Physical Therapy

## 2020-12-25 ENCOUNTER — Ambulatory Visit
Admission: EM | Admit: 2020-12-25 | Discharge: 2020-12-25 | Disposition: A | Discharge status: Home / Self Care | Payer: Medicaid Other | Attending: Family Medicine | Admitting: Family Medicine

## 2020-12-25 ENCOUNTER — Ambulatory Visit: Payer: Medicaid Other | Admitting: Physical Therapy

## 2020-12-25 ENCOUNTER — Other Ambulatory Visit: Payer: Self-pay

## 2020-12-25 ENCOUNTER — Encounter: Payer: Self-pay | Admitting: Physical Therapy

## 2020-12-25 DIAGNOSIS — L290 Pruritus ani: Secondary | ICD-10-CM

## 2020-12-25 DIAGNOSIS — R278 Other lack of coordination: Secondary | ICD-10-CM

## 2020-12-25 DIAGNOSIS — M6281 Muscle weakness (generalized): Secondary | ICD-10-CM

## 2020-12-25 MED ORDER — HYDROCORTISONE (PERIANAL) 2.5 % EX CREA: 1.0000 "application " | TOPICAL_CREAM | Freq: Two times a day (BID) | CUTANEOUS | 0 refills | Status: AC | PRN

## 2020-12-25 NOTE — Therapy (Signed)
[facility] [address] [place], [place], [address] Phone: [phone]   Fax:  [phone]  Physical Therapy Treatment  Patient Details  Name: Pamela Griffin MRN: [id number] Date of Birth: 12-09-1985 Referring Provider (PT): Wynelle Cleveland   Encounter Date: 12/25/2020   PT End of Session - 12/25/20 1417     Visit Number 2    Number of Visits 12    Date for PT Re-Evaluation 03/07/21    Authorization Type IE 12/13/2020    PT Start Time 3212    PT Stop Time 2482    PT Time Calculation (min) 55 min    Activity Tolerance Patient tolerated treatment well    Behavior During Therapy [facility] for tasks assessed/performed             Past Medical History:  Diagnosis Date   FAP (familial adenomatous polyposis)    Hypothyroidism     Past Surgical History:  Procedure Laterality Date   ECTOPIC PREGNANCY SURGERY  2015   HAND SURGERY  2003   MANDIBLE FRACTURE SURGERY  2010   OVARIAN CYST REMOVAL  2014   TOTAL THYROIDECTOMY  2011    There were no vitals filed for this visit.   Subjective Assessment - 12/25/20 1419     Subjective Patient notes that she has not had significant changes since evaluation. She did keep a bladder diary which indicates modest urge an occasional low void volume. Leakage is consistently occurring in the afternoon.    Currently in Pain? No/denies            TREATMENT  Pre-treatment assessment: ABDOMINAL:  Palpation: mild tenderness to RUQ Diastasis: 1 finger superior, 2 finger umbilicus, 2.5 finger inferior  Rib flare: none noted  Neuromuscular Re-education: Patient educated extensively on typical bladder function, typical bladder habits, and bladder in order to better regulate bladder through behavioral changes.  Supine hooklying diaphragmatic breathing with VCs and TCs for downregulation of the nervous system and improved management of IAP Supine hooklying, TrA activation with exhalation. VCs  and TCs to decrease compensatory patterns and minimize aggravation of the lumbar paraspinals.   Patient educated throughout session on appropriate technique and form using multi-modal cueing, HEP, and activity modification. Patient articulated understanding and returned demonstration.  Patient Response to interventions: Comfortable to work on abdominal rehab until next visit.  ASSESSMENT Patient presents to clinic with excellent motivation to participate in therapy. Patient demonstrates deficits in IAP management, PFM strength, and PFM coordination. Patient able to achieve TrA activation with coordinated breath and gentle compression during today's session and responded positively to educational interventions. Patient will benefit from continued skilled therapeutic intervention to address remaining deficits in IAP management, PFM strength, and PFM coordination in order to increase function and improve overall QOL.    PT Long Term Goals - 12/13/20 1629       PT LONG TERM GOAL #1   Title Patient will demonstrate independence with HEP in order to maximize therapeutic gains and improve carryover from physical therapy sessions to ADLs in the home and community.    Baseline IE: not initiated    Time 12    Period Weeks    Status New    Target Date 03/07/21      PT LONG TERM GOAL #2   Title Patient will demonstrate circumferential and sequential contraction of >4/5 MMT, > 6 sec hold x10 and 5 consecutive quick flicks with </= 10 min rest between testing bouts, and relaxation of  the PFM coordinated with breath for improved management of intra-abdominal pressure and normal bowel and bladder function without the presence of pain nor incontinence in order to improve participation at home and in the community.    Baseline IE: assess at next visit    Time 12    Period Weeks    Status New    Target Date 03/07/21      PT LONG TERM GOAL #3   Title Patient will demonstrate improved function as  evidenced by a score of 65 on FOTO measure for full participation in activities at home and in the community.    Baseline IE: 96    Time 12    Period Weeks    Status New    Target Date 03/07/21      PT LONG TERM GOAL #4   Title Patient will report "no difficulty" with participation in physical activities with respect to urinary problem for full participation in activities at home and in the community.    Baseline IE: "quite a bit of difficulty"    Time 12    Period Weeks    Status New    Target Date 03/07/21      PT LONG TERM GOAL #5   Title Patient will indicate "complete confidence" (or > 7/10) in her ability to control her bladder and prevent urinary leakage for improved QoL and participation.    Baseline IE: "moderate confidence"    Time 12    Period Weeks    Status New    Target Date 03/07/21                   Plan - 12/25/20 1418     Clinical Impression Statement Patient presents to clinic with excellent motivation to participate in therapy. Patient demonstrates deficits in IAP management, PFM strength, and PFM coordination. Patient able to achieve TrA activation with coordinated breath and gentle compression during today's session and responded positively to educational interventions. Patient will benefit from continued skilled therapeutic intervention to address remaining deficits in IAP management, PFM strength, and PFM coordination in order to increase function and improve overall QOL.    Personal Factors and Comorbidities Behavior Pattern;Comorbidity 2;Past/Current Experience;Time since onset of injury/illness/exacerbation;Profession    Comorbidities hypothyroidism, familial adenomatous polyposis    Examination-Activity Limitations Transfers;Squat;Lift;Bend;Continence    Examination-Participation Restrictions Occupation;Cleaning;Laundry;Community Activity;Shop    Stability/Clinical Decision Making Evolving/Moderate complexity    Rehab Potential Good    PT  Frequency 1x / week    PT Duration 12 weeks    PT Treatment/Interventions ADLs/Self Care Home Management;Biofeedback;Cryotherapy;Electrical Stimulation;Moist Heat;Therapeutic exercise;Neuromuscular re-education;Patient/family education;Orthotic Fit/Training;Manual techniques;Scar mobilization;Taping;Joint Manipulations;Spinal Manipulations    PT Next Visit Plan external PFM assessment; PFM training    PT Home Exercise Plan DRAM    Consulted and Agree with Plan of Care Patient             Patient will benefit from skilled therapeutic intervention in order to improve the following deficits and impairments:  Improper body mechanics, Decreased activity tolerance, Decreased coordination, Decreased strength, Decreased endurance  Visit Diagnosis: Other lack of coordination  Muscle weakness (generalized)     Problem List Patient Active Problem List   Diagnosis Date Noted   Pregnancy 10/27/2016    Myles Gip PT, DPT [phone]  12/25/2020, 3:42 PM  [facility] [address] [place], [place], [address] Phone: [phone]   Fax:  [phone]  Name: Pamela Griffin MRN: [id number] Date of Birth: June 01, 1986

## 2020-12-25 NOTE — ED Triage Notes (Signed)
Patient states that she was seen at [facility] on 06/17 and diagnosed with pinworms. States that she completed the medicine and improved and now states that itching is worse.

## 2020-12-25 NOTE — Discharge Instructions (Addendum)
Take the med on an empty stomach.  Return with stool specimen.   Take care  Dr. Lacinda Axon

## 2020-12-26 ENCOUNTER — Ambulatory Visit: Payer: Medicaid Other | Admitting: Physical Therapy

## 2020-12-26 ENCOUNTER — Other Ambulatory Visit
Admission: RE | Admit: 2020-12-26 | Discharge: 2020-12-26 | Disposition: A | Discharge status: Home / Self Care | Payer: Medicaid Other | Source: Ambulatory Visit | Attending: Family Medicine | Admitting: Family Medicine

## 2020-12-26 DIAGNOSIS — Z119 Encounter for screening for infectious and parasitic diseases, unspecified: Secondary | ICD-10-CM | POA: Diagnosis present

## 2020-12-26 NOTE — ED Provider Notes (Signed)
[facility]    CSN: [id number] Arrival date & time: 12/25/20  1523  History   Chief Complaint Chief Complaint  Patient presents with   Anal Itching    HPI 35 year old female presents with the above complaint.  Recently developed severe anal itching. Was seen at [facility] on 6/17 and was diagnosed with pinworms. Treating with Albendazole. She believes that she has seen worms in her stool. Had initial improvement in anal itching. However, it has now returned. No other associated symptoms.   Past Medical History:  Diagnosis Date   FAP (familial adenomatous polyposis)    Hypothyroidism     Patient Active Problem List   Diagnosis Date Noted   Pregnancy 10/27/2016    Past Surgical History:  Procedure Laterality Date   ECTOPIC PREGNANCY SURGERY  2015   HAND SURGERY  2003   MANDIBLE FRACTURE SURGERY  2010   OVARIAN CYST REMOVAL  2014   TOTAL THYROIDECTOMY  2011    OB History     Gravida  4   Para  1   Term  1   Preterm      AB      Living  1      SAB      IAB      Ectopic      Multiple      Live Births  1            Home Medications    Prior to Admission medications   Medication Sig Start Date End Date Taking? Authorizing Provider  hydrocortisone (ANUSOL-HC) 2.5 % rectal cream Place 1 application rectally 2 (two) times daily as needed for anal itching. 12/25/20  Yes Lucas Exline G, DO  thyroid (ARMOUR) 60 MG tablet Take 60 mg by mouth daily before breakfast.   Yes [provider]    Family History History reviewed. No pertinent family history.  Social History Social History   Tobacco Use   Smoking status: Never   Smokeless tobacco: Never  Substance Use Topics   Alcohol use: No   Drug use: No     Allergies   Patient has no known allergies.   Review of Systems Review of Systems  Constitutional: Negative.   Gastrointestinal:        Anal itching.    Physical Exam Triage Vital Signs ED Triage Vitals  Enc  Vitals Group     BP 12/25/20 1618 105/73     Pulse Rate 12/25/20 1618 67     Resp 12/25/20 1618 14     Temp 12/25/20 1618 98.2 F (36.8 C)     Temp Source 12/25/20 1618 Oral     SpO2 12/25/20 1618 99 %     Weight 12/25/20 1616 140 lb (63.5 kg)     Height 12/25/20 1616 5\' 2"  (1.575 m)     Head Circumference --      Peak Flow --      Pain Score 12/25/20 1616 8     Pain Loc --      Pain Edu? --      Excl. in [place]? --    Updated Vital Signs BP 105/73 (BP Location: Right Arm)   Pulse 67   Temp 98.2 F (36.8 C) (Oral)   Resp 14   Ht 5\' 2"  (1.575 m)   Wt 63.5 kg   SpO2 99%   BMI 25.61 kg/m   Visual Acuity Right Eye Distance:   Left Eye Distance:  Bilateral Distance:    Right Eye Near:   Left Eye Near:    Bilateral Near:     Physical Exam Constitutional:      General: She is not in acute distress.    Appearance: Normal appearance. She is not ill-appearing.  HENT:     Head: Normocephalic and atraumatic.  Cardiovascular:     Rate and Rhythm: Normal rate and regular rhythm.  Pulmonary:     Effort: Pulmonary effort is normal.     Breath sounds: Normal breath sounds.  Neurological:     Mental Status: She is alert.  Psychiatric:        Mood and Affect: Mood normal.        Behavior: Behavior normal.     UC Treatments / Results  Labs (all labs ordered are listed, but only abnormal results are displayed) Labs Reviewed - No data to display  EKG   Radiology No results found.  Procedures Procedures (including critical care time)  Medications Ordered in UC Medications - No data to display  Initial Impression / Assessment and Plan / UC Course  I have reviewed the triage vital signs and the nursing notes.  Pertinent labs & imaging results that were available during my care of the patient were reviewed by me and considered in my medical decision making (see chart for details).    35 year old female presents with anal itching. Possible pinworms. Has been treated  by another urgent care. Advised to complete treatment with Albendazole. Anusol as directed for itching. Patient to return with stool sample for O&P.  Final Clinical Impressions(s) / UC Diagnoses   Final diagnoses:  Rectal itching     Discharge Instructions      Take the med on an empty stomach.  Return with stool specimen.   Take care  Dr. Lacinda Axon    ED Prescriptions     Medication Sig Dispense Auth. Provider   hydrocortisone (ANUSOL-HC) 2.5 % rectal cream Place 1 application rectally 2 (two) times daily as needed for anal itching. 30 g Coral Spikes, DO      PDMP not reviewed this encounter.   Coral Spikes, [place] 12/26/20 2214

## 2020-12-27 ENCOUNTER — Encounter: Payer: Medicaid Other | Admitting: Physical Therapy

## 2020-12-29 LAB — O&P RESULT

## 2020-12-29 LAB — OVA + PARASITE EXAM

## 2021-01-10 ENCOUNTER — Encounter: Payer: Medicaid Other | Admitting: Physical Therapy

## 2021-01-12 ENCOUNTER — Ambulatory Visit: Payer: Medicaid Other | Admitting: Physical Therapy

## 2021-01-16 ENCOUNTER — Encounter: Payer: Self-pay | Admitting: Physical Therapy

## 2021-01-17 ENCOUNTER — Encounter: Payer: Self-pay | Admitting: Physical Therapy

## 2021-01-24 ENCOUNTER — Encounter: Payer: Self-pay | Admitting: Physical Therapy

## 2023-07-29 ENCOUNTER — Ambulatory Visit: Payer: Medicaid Other | Admitting: Physical Therapy

## 2023-08-06 ENCOUNTER — Ambulatory Visit: Payer: Medicaid Other | Admitting: Physical Therapy

## 2023-08-13 ENCOUNTER — Encounter: Payer: Self-pay | Admitting: Physical Therapy

## 2023-08-13 ENCOUNTER — Ambulatory Visit: Payer: Medicaid Other | Attending: Obstetrics and Gynecology | Admitting: Physical Therapy

## 2023-08-13 DIAGNOSIS — M533 Sacrococcygeal disorders, not elsewhere classified: Secondary | ICD-10-CM | POA: Diagnosis present

## 2023-08-13 DIAGNOSIS — R278 Other lack of coordination: Secondary | ICD-10-CM | POA: Diagnosis present

## 2023-08-13 DIAGNOSIS — R2689 Other abnormalities of gait and mobility: Secondary | ICD-10-CM | POA: Insufficient documentation

## 2023-08-13 NOTE — Therapy (Unsigned)
OUTPATIENT PHYSICAL THERAPY EVALUATION   Patient Name: Pamela Griffin MRN: [id number] DOB:[birth year], 38 y.o., female Today's Date: 08/13/2023   PT End of Session - 08/13/23 0817     Visit Number 1    Number of Visits 10    Date for PT Re-Evaluation 10/22/23    Authorization Type Medicaid    PT Start Time 0805    PT Stop Time 0845    PT Time Calculation (min) 40 min    Activity Tolerance Patient tolerated treatment well;No increased pain    Behavior During Therapy WFL for tasks assessed/performed             Past Medical History:  Diagnosis Date   FAP (familial adenomatous polyposis)    Hypothyroidism    Past Surgical History:  Procedure Laterality Date   ECTOPIC PREGNANCY SURGERY  2015   HAND SURGERY  2003   L fallopian tube removed      MANDIBLE FRACTURE SURGERY  2010   OVARIAN CYST REMOVAL Left 2014   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   PCP:   REFERRING PROVIDER: Raliegh Scarlet   REFERRING DIAG: R32 (ICD-10-CM) - Unspecified urinary incontinence  Rationale for Evaluation and Treatment Rehabilitation  THERAPY DIAG:  Other abnormalities of gait and mobility  Sacrococcygeal disorders, not elsewhere classified  ONSET DATE:   SUBJECTIVE:                                                                                                                                                                                           SUBJECTIVE STATEMENT: 1)  SUI:  laughing, coughing, yelling, working out    Denied urge incontinence  Daily BMs, denied straining, 70 % of the time Type 4 stool consistency , 30% of time Type 3  90-100 fl oz of water   2) L shoulder discomfort when increasing weight or too many reps .  Pt had shoulder injury in 2024 due to incident  in the gym with either too much weight or too many reps. Pt finished PT for 2-3 month for L shoulder pain.  NO more pain just discomfort at the head of shoulder socket.  No numbness / tingling.   Pt just to workout 6 days week, now is 3-4 week Burn [facility].  Includes sit up and crunches,  No consistent stretches . 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    OBJECTIVE IMPAIRMENTS decreased activity tolerance, decreased coordination, decreased endurance, decreased mobility, difficulty walking, decreased ROM, decreased strength, decreased safety awareness, hypomobility, increased muscle spasms, impaired flexibility, improper body mechanics, postural dysfunction, and pain. scar restrictions   ACTIVITY LIMITATIONS  self-care,   home chores, work tasks    PARTICIPATION LIMITATIONS:  community, gym activities    PERSONAL FACTORS        are also affecting patient's functional outcome.    REHAB POTENTIAL: Good   CLINICAL DECISION MAKING: Evolving/moderate complexity   EVALUATION COMPLEXITY: Moderate    PATIENT EDUCATION:    Education details: Showed pt anatomy images.  Explained muscles attachments/ connection, physiology of deep core system/ spinal- thoracic-pelvis-lower kinetic chain as they relate to pt's presentation, Sx, and past Hx. Explained what and how these areas of deficits need to be restored to balance and function    See Therapeutic activity / neuromuscular re-education section  Answered pt's questions.   Person educated: Patient Education method: Explanation, Demonstration, Tactile cues, Verbal cues, and Handouts Education comprehension: verbalized understanding, returned demonstration, verbal cues required, tactile cues required, and needs further education     PLAN: PT FREQUENCY: 1x/week   PT DURATION: 10 weeks   PLANNED INTERVENTIONS: Therapeutic exercises, Therapeutic activity, Neuromuscular re-education, Balance training, Gait training, Patient/Family education, Self Care, Joint mobilization, Spinal mobilization, Moist heat, Taping, and Manual therapy, dry needling.   PLAN FOR NEXT SESSION: See clinical impression for plan     GOALS: Goals reviewed with patient? Yes  SHORT TERM GOALS: Target date:  09/10/2023    Pt will demo IND with HEP                    Baseline: Not IND            Goal status: INITIAL   LONG TERM GOALS: Target date: 10/22/2023    1.Pt will demo proper deep core coordination without chest breathing and optimal excursion of diaphragm/pelvic floor in order to promote spinal stability and pelvic floor function  Baseline: dyscoordination Goal status: INITIAL  2.  Pt will demo proper body mechanics in against gravity tasks and ADLs  work tasks, fitness  to minimize straining pelvic floor / back    Baseline: not IND, improper form that places strain on pelvic floor  Goal status: INITIAL    3. Pt will demo increased gait speed > 1.3 m/s with reciprocal gait pattern, longer stride length  in order to ambulate safely in community and return to fitness routine  Baseline:  Goal status:  INITIAL    4.   Baseline:  Goal status: INITIAL   5.  Baseline:  Goal status: INITIAL     Mariane Masters, PT 08/13/2023, 8:29 AM

## 2023-08-13 NOTE — Patient Instructions (Signed)

## 2023-08-15 NOTE — Addendum Note (Signed)
Addended by: Mariane Masters on: 08/15/2023 09:57 AM   Modules accepted: Orders

## 2023-08-15 NOTE — Addendum Note (Signed)
Addended by: Mariane Masters on: 08/15/2023 11:10 AM   Modules accepted: Orders

## 2023-08-20 ENCOUNTER — Ambulatory Visit: Payer: Medicaid Other | Admitting: Physical Therapy

## 2023-08-22 ENCOUNTER — Ambulatory Visit: Payer: Medicaid Other | Admitting: Physical Therapy

## 2023-08-22 DIAGNOSIS — R278 Other lack of coordination: Secondary | ICD-10-CM

## 2023-08-22 DIAGNOSIS — M533 Sacrococcygeal disorders, not elsewhere classified: Secondary | ICD-10-CM

## 2023-08-22 DIAGNOSIS — R2689 Other abnormalities of gait and mobility: Secondary | ICD-10-CM

## 2023-08-22 NOTE — Therapy (Signed)
OUTPATIENT PHYSICAL THERAPY TREATMENT   Patient Name: Niomie Englert MRN: [id number] DOB:30-Jan-1986, 38 y.o., female Today's Date: 08/22/2023   PT End of Session - 08/22/23 1028     Visit Number 2    Number of Visits 10    Date for PT Re-Evaluation 10/22/23    Authorization Type Medicaid    PT Start Time 1025    PT Stop Time 1105    PT Time Calculation (min) 40 min    Activity Tolerance Patient tolerated treatment well;No increased pain    Behavior During Therapy WFL for tasks assessed/performed             Past Medical History:  Diagnosis Date   FAP (familial adenomatous polyposis)    Hypothyroidism    Past Surgical History:  Procedure Laterality Date   ECTOPIC PREGNANCY SURGERY  2015   HAND SURGERY  2003   L fallopian tube removed      MANDIBLE FRACTURE SURGERY  2010   OVARIAN CYST REMOVAL Left 2014   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   PCP:   REFERRING PROVIDER: Raliegh Scarlet   REFERRING DIAG: R32 (ICD-10-CM) - Unspecified urinary incontinence  Rationale for Evaluation and Treatment Rehabilitation  THERAPY DIAG:  No diagnosis found.  ONSET DATE:   SUBJECTIVE:       SUBJECTIVE STATEMENT  TODAY  Pt has been practicing sitting not crossing her legs. She notices she is more manly.   Pt has been told in middle school or high school, she had scoliosis.                                                                                                                                                                                     SUBJECTIVE STATEMENT  EVAL  08/13/23:  1)  SUI:  laughing, coughing, yelling, working out    Denied urge incontinence  Daily BMs, denied straining, 70 % of the time Type 4 stool consistency , 30% of time Type 3  90-100 fl  oz of water   2) L shoulder discomfort when increasing weight or too many reps .  Pt had shoulder injury in 2024 due to incident in the gym with either too much weight or too many reps. Pt finished PT for 2-3 month for L shoulder pain.  NO more pain just discomfort at the head of shoulder socket.  No numbness / tingling.   Pt  just to workout 6 days week, now is 3-4 week Burn [facility].  Includes sit up and crunches,  No consistent stretches . 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      Palpation   SI assessment  levelled iliac crest , L shoulder lowered,    Palpation comment tightness along medial scap/ C/T junction, convex curve R thoracic,      Bed Mobility   Bed Mobility --   ab straining , not with log rolling technique            [facility] Adult PT Treatment/Exercise - 08/22/23 1050       Self-Care   Other Self-Care Comments  discused the role of logrolling in and out of beed ot minimzie straining pelvic floor, wrote  letter for pt to puase her Burn [employer] while undergoing Pelvic PT      Neuro Re-ed    Neuro Re-ed Details  cued for log rolling and scooting in bed, cued for C/T junction mobility to level out spine and shoulders      Manual Therapy   Manual therapy comments STM/MWM at thoracic, medial scapular, C/ T junction                HOME EXERCISE PROGRAM: See pt instruction section    ASSESSMENT:  CLINICAL IMPRESSION:                         Following Tx today which pt tolerated without complaints,  pt demo'd  realigned thoracic segments, restored L full shoulder abduction without pain, and demo'd proper technique with logrolling which will help with goals.   Pt requested a letter to hold [facility] membership which was provided to pt. Pt remains motivated to focus only on Pelvic PT and not continue with gym classes simultaneously.  This decision will help pt make progress towards her goals.   These improvements will help promote optimize IAP system for improved pelvic floor function, trunk stability, gait, balance, stabilization with mobility tasks.     Regional interdependent approaches will yield greater benefits in pt's POC.  Plan to address pelvic floor issues once pelvis and spine are realigned to yield better outcomes.  Pt benefits from skilled PT.    OBJECTIVE IMPAIRMENTS decreased  activity tolerance, decreased coordination, decreased endurance, decreased mobility, difficulty walking, decreased ROM, decreased strength, decreased safety awareness, hypomobility, increased muscle spasms, impaired flexibility, improper body mechanics, postural dysfunction, and pain. scar restrictions   ACTIVITY LIMITATIONS  self-care,   home chores, work tasks    PARTICIPATION LIMITATIONS:  community, gym activities    PERSONAL FACTORS        are also affecting patient's functional outcome.    REHAB POTENTIAL: Good   CLINICAL DECISION MAKING: Evolving/moderate complexity    EVALUATION COMPLEXITY: Moderate    PATIENT EDUCATION:    Education details: Showed pt anatomy images. Explained muscles attachments/ connection, physiology of deep core system/ spinal- thoracic-pelvis-lower kinetic chain as they relate to pt's presentation, Sx, and past Hx. Explained what and how these areas of deficits need to be restored to balance and function    See Therapeutic activity / neuromuscular re-education section  Answered pt's questions.   Person educated: Patient Education method: Explanation, Demonstration, Tactile cues, Verbal cues, and Handouts Education comprehension: verbalized understanding, returned demonstration, verbal cues required, tactile cues required, and needs further education     PLAN: PT FREQUENCY: 1x/week   PT DURATION: 10 weeks   PLANNED INTERVENTIONS: Therapeutic exercises, Therapeutic activity, Neuromuscular re-education, Balance training, Gait training, Patient/Family education, Self Care, Joint mobilization, Spinal mobilization, Moist heat, Taping, and Manual therapy, dry needling.   PLAN FOR NEXT SESSION: See clinical impression for plan     GOALS: Goals reviewed with patient? Yes  SHORT TERM GOALS: Target date:  09/10/2023    Pt will demo IND with HEP                    Baseline: Not IND            Goal status: INITIAL   LONG TERM GOALS: Target date: 10/22/2023    1.Pt will demo proper deep core coordination without chest breathing and optimal excursion of diaphragm/pelvic floor in order to promote spinal stability and pelvic floor function  Baseline: dyscoordination Goal status: INITIAL  2.  Pt will demo proper body mechanics in against gravity tasks and ADLs  work tasks, fitness  to minimize straining pelvic floor / back    Baseline: not IND, improper form that places strain on pelvic floor  Goal status: INITIAL    3. Pt will demo increased gait speed > 1.6 m/s with reciprocal gait pattern, longer stride length  in order  to ambulate safely in community and return to fitness routine  Baseline: 1.48 m/s, limited R pelvic/ R shoulder  posterior rotation Goal status: INITIAL    4. Pt will demo levelled pelvic girdle and shoulder height in order to progress to deep core strengthening HEP and restore mobility at spine, pelvis, gait, posture minimize falls, and improve balance  Baseline: R shoulder / iliac crest lowered  Goal status: INITIAL   5. Pt will return to gym activities with no L shoulder discomfort with compliance to realignment HEP and deep mm strengthening HEP across 1 week Goal status: INITIAL  6. Pt will report no leakage episode across 1 week  when yelling, coughing, sneezing Baseline: leakage  Goal status: INITIAL    Mariane Masters, PT 08/22/2023, 10:31 AM

## 2023-08-22 NOTE — Patient Instructions (Signed)
  Avoid straining pelvic floor, abdominal muscles , spine  Use log rolling technique instead of getting out of bed with your neck or the sit-up     Log rolling into and out of bed   Log rolling into and out of bed If getting out of bed on R side, Bent knees, scoot hips/ shoulder to L  Raise R arm completely overhead, rolling onto armpit  Then lower bent knees to bed to get into complete side lying position  Then drop legs off bed, and push up onto R elbow/forearm, and use L hand to push onto the bed    Dig elbows and feet to lift hte buttocks and scoot without lifting head   __   Stretches :    Neck / shoulder stretches:    Lying on back - small sushi roll towel under neck  _ 6 directions   Chin up, down Rotation like changing lanes when driving Ear to shoulder like puppy dog   10 reps

## 2023-08-27 ENCOUNTER — Ambulatory Visit: Payer: Medicaid Other | Admitting: Physical Therapy

## 2023-08-27 DIAGNOSIS — R278 Other lack of coordination: Secondary | ICD-10-CM

## 2023-08-27 DIAGNOSIS — R2689 Other abnormalities of gait and mobility: Secondary | ICD-10-CM | POA: Diagnosis not present

## 2023-08-27 DIAGNOSIS — M533 Sacrococcygeal disorders, not elsewhere classified: Secondary | ICD-10-CM

## 2023-08-27 NOTE — Therapy (Signed)
 OUTPATIENT PHYSICAL THERAPY TREATMENT   Patient Name: Latrena Benegas MRN: [id number] DOB:[birth year], 38 y.o., female Today's Date: 08/27/2023   PT End of Session - 08/27/23 1020     Visit Number 3    Number of Visits 10    Date for PT Re-Evaluation 10/22/23    Authorization Type Medicaid    PT Start Time 1016    PT Stop Time 1105    PT Time Calculation (min) 49 min    Activity Tolerance Patient tolerated treatment well;No increased pain    Behavior During Therapy WFL for tasks assessed/performed             Past Medical History:  Diagnosis Date   FAP (familial adenomatous polyposis)    Hypothyroidism    Past Surgical History:  Procedure Laterality Date   ECTOPIC PREGNANCY SURGERY  2015   HAND SURGERY  2003   L fallopian tube removed      MANDIBLE FRACTURE SURGERY  2010   OVARIAN CYST REMOVAL Left 2014   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   PCP:   REFERRING PROVIDER: Raliegh Scarlet   REFERRING DIAG: R32 (ICD-10-CM) - Unspecified urinary incontinence  Rationale for Evaluation and Treatment Rehabilitation  THERAPY DIAG:  Other abnormalities of gait and mobility  Sacrococcygeal disorders, not elsewhere classified  Other lack of coordination  ONSET DATE:   SUBJECTIVE:       SUBJECTIVE STATEMENT  TODAY  Pt has been practicing sitting not crossing her legs. She notices she is more manly.   Pt has been told in middle school or high school, she had scoliosis.                                                                                                                                                                                     SUBJECTIVE STATEMENT  EVAL  08/13/23:  1)  SUI:  laughing, coughing, yelling, working out    Denied urge incontinence   Daily BMs, denied straining, 70 % of the time Type 4 stool consistency , 30% of time Type 3  90-100 fl oz of water   2) L shoulder discomfort when increasing weight or too many reps .  Pt had shoulder injury in 2024 due to incident in the gym with either too much weight or too many reps. Pt finished PT for 2-3 month for L shoulder pain.  NO more pain just discomfort  at the head of shoulder socket.  No numbness / tingling.   Pt just to workout 6 days week, now is 3-4 week [facility].  Includes sit up and crunches,  No consistent stretches . 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   PLOF: IND   PATIENT GOALS:  Getting pelvic floor strengthened so she can jump, sneeze and not leakage    OBJECTIVE:   [facility] PT Assessment - 08/27/23 1021       Observation/Other Assessments   Observations reach behind  BLE digit III T7      AROM   Overall AROM Comments B shoulder abduction 180 deg      Palpation   SI assessment  R shoulder higher ,    Palpation comment deviated L C5-7, T1-2 , tightness along R medial scapular             [facility] Adult PT Treatment/Exercise - 08/27/23 1115       Self-Care   Other Self-Care Comments  showed cervico scapulothoracic, thoracolumbar system mm for correcting rounded shoulders and forward head which helps with shoulder issues and pelvic floor      Neuro Re-ed    Neuro Re-ed Details   excessive cues for cervicoscapular stabilization HEP ( see pti nstructions)      Modalities   Modalities Moist Heat      Moist Heat Therapy   Number Minutes Moist Heat 5 Minutes    Moist Heat Location --   C/T junction in prone, guided relaxation ( unbilled)     Manual Therapy   Manual therapy comments distraction at occiput,  STM/MWM at problem areas noted in assessment) to promote realigned spine and optimal deep core system                HOME EXERCISE PROGRAM: See pt instruction section    ASSESSMENT:  CLINICAL IMPRESSION:                    Pt showed good carry over with restored shoulder abduction 180 deg and reach behind back with L shoulder   Following Tx today which pt tolerated without complaints,  pt demo'd  realigned at C/T junction which will help her with minimizing forward head/ rounded shoulder posture.  L lower cervical, upper thoracic segments were deviated and were corrected with manual Tx. Discussed avoid sleeping on belly to minimize these deviations.   Initiated cervicoscapular stabilization and strengthening today with excessive cues for coordination. Pt demo'd correctly post Tx. Plan to add resistance band next session for C/T strengthening and progress to deep core training.   These improvements will help promote optimize IAP system for improved pelvic floor function, trunk stability, gait, balance, stabilization with mobility tasks.     Regional interdependent approaches will yield greater benefits in pt's POC.  Plan to  address pelvic floor issues once pelvis and spine are realigned to yield better outcomes.  Pt benefits from skilled PT.    OBJECTIVE IMPAIRMENTS decreased activity tolerance, decreased coordination, decreased endurance, decreased mobility, difficulty walking, decreased ROM, decreased strength, decreased safety awareness, hypomobility, increased muscle spasms, impaired flexibility, improper body mechanics, postural dysfunction, and  pain. scar restrictions   ACTIVITY LIMITATIONS  self-care,   home chores, work tasks    PARTICIPATION LIMITATIONS:  community, gym activities    PERSONAL FACTORS        are also affecting patient's functional outcome.    REHAB POTENTIAL: Good   CLINICAL DECISION MAKING: Evolving/moderate complexity   EVALUATION COMPLEXITY: Moderate    PATIENT EDUCATION:    Education details: Showed pt anatomy images. Explained muscles attachments/ connection, physiology of deep core system/ spinal- thoracic-pelvis-lower kinetic chain as they relate to pt's presentation, Sx, and past Hx. Explained what and how these areas of deficits need to be restored to balance and function    See Therapeutic activity / neuromuscular re-education section  Answered pt's questions.   Person educated: Patient Education method: Explanation, Demonstration, Tactile cues, Verbal cues, and Handouts Education comprehension: verbalized understanding, returned demonstration, verbal cues required, tactile cues required, and needs further education     PLAN: PT FREQUENCY: 1x/week   PT DURATION: 10 weeks   PLANNED INTERVENTIONS: Therapeutic exercises, Therapeutic activity, Neuromuscular re-education, Balance training, Gait training, Patient/Family education, Self Care, Joint mobilization, Spinal mobilization, Moist heat, Taping, and Manual therapy, dry needling.   PLAN FOR NEXT SESSION: See clinical impression for plan     GOALS: Goals reviewed with patient? Yes  SHORT TERM GOALS: Target date:  09/10/2023    Pt will demo IND with HEP                    Baseline: Not IND            Goal status: INITIAL   LONG TERM GOALS: Target date: 10/22/2023    1.Pt will demo proper deep core coordination without chest breathing and optimal excursion of diaphragm/pelvic floor in order to promote spinal stability and pelvic floor function  Baseline: dyscoordination Goal status: INITIAL  2.  Pt will demo proper body  mechanics in against gravity tasks and ADLs  work tasks, fitness  to minimize straining pelvic floor / back    Baseline: not IND, improper form that places strain on pelvic floor  Goal status: INITIAL    3. Pt will demo increased gait speed > 1.6 m/s with reciprocal gait pattern, longer stride length  in order to ambulate safely in community and return to fitness routine  Baseline: 1.48 m/s, limited R pelvic/ R shoulder  posterior rotation Goal status: INITIAL    4. Pt will demo levelled pelvic girdle and shoulder height in order to progress to deep core strengthening HEP and restore mobility at spine, pelvis, gait, posture minimize falls, and improve balance  Baseline: R shoulder / iliac crest lowered  Goal status: INITIAL   5. Pt will return to gym activities with no L shoulder discomfort with compliance to realignment HEP and deep mm strengthening HEP across 1 week Goal status: INITIAL  6. Pt will report no leakage episode across 1 week  when yelling, coughing, sneezing Baseline: leakage  Goal status: INITIAL    Mariane Masters, PT 08/27/2023, 11:20 AM

## 2023-08-27 NOTE — Patient Instructions (Signed)
 Childs pose rocking   Toes tucked, shoulders down and back, on forearms , hands shoulder width apart, fingers straight, elbow back , squeeze imaginary pencils in armpit, shoulder down and away from ears  5  reps   __    CRICKET   On belly, palms under armpits, elbows pointed to ceiling  Inhale: lengthen crown of the head away from shoulders Exhale, feel belly hug in and press palms into the floor, squeezing elbows/shoulder blades towards each other while chest lifts about 5 cm off of the floor. KEEP CHIN TUCKED LIKE YOU ARE HOLDING AN APPLE UNDER CHIN, GAZE AT THE FLOOR OR BED. You should feel the hinging movement at mid back and not the low back.   15 reps      Locust pose  Pillow under hips if needed for decreased low back pain  Palms face midline by hips  Finger tips shooting straight down  Imagine holding pencil under your armpits Draw shoulders away from ears Inhale Exhale lift chest up slightly without feeling it in your back. The bend happens in the midback  (keep chin tucked)  15 reps

## 2023-09-03 ENCOUNTER — Ambulatory Visit: Payer: Medicaid Other | Attending: Obstetrics and Gynecology | Admitting: Physical Therapy

## 2023-09-03 DIAGNOSIS — R278 Other lack of coordination: Secondary | ICD-10-CM | POA: Insufficient documentation

## 2023-09-03 DIAGNOSIS — R2689 Other abnormalities of gait and mobility: Secondary | ICD-10-CM | POA: Insufficient documentation

## 2023-09-03 DIAGNOSIS — M533 Sacrococcygeal disorders, not elsewhere classified: Secondary | ICD-10-CM | POA: Insufficient documentation

## 2023-09-08 ENCOUNTER — Encounter: Payer: Self-pay | Admitting: Physical Therapy

## 2023-09-10 ENCOUNTER — Ambulatory Visit: Payer: Medicaid Other | Admitting: Physical Therapy

## 2023-09-15 ENCOUNTER — Ambulatory Visit: Admitting: Physical Therapy

## 2023-09-17 ENCOUNTER — Ambulatory Visit: Payer: Medicaid Other | Admitting: Physical Therapy

## 2023-09-22 ENCOUNTER — Ambulatory Visit: Admitting: Physical Therapy

## 2023-09-22 DIAGNOSIS — R278 Other lack of coordination: Secondary | ICD-10-CM

## 2023-09-22 DIAGNOSIS — M533 Sacrococcygeal disorders, not elsewhere classified: Secondary | ICD-10-CM

## 2023-09-22 DIAGNOSIS — R2689 Other abnormalities of gait and mobility: Secondary | ICD-10-CM | POA: Diagnosis present

## 2023-09-22 NOTE — Patient Instructions (Addendum)
  Lengthen Back rib by L  shoulder ( winging)    Lie on R  side , pillow between knees and under head  Pull  L arm overhead over mattress, grab the edge of mattress,pull it upward, drawing elbow away from ears  Breathing 10 reps  Brushing arm with 3/4 turn onto pillow behind back  Lying on R  side ,Pillow/ Block between knees     dragging top forearm across ribs below breast rotating 3/4 turn,  rotating  _L_ only this week ,  relax onto the pillow behind the back  and then back to other palm , maintain top palm on body whole top and not lift shoulder  Do this side this week       Wait do both sides until we have levelled out your spine and shoulders ___   Lengthen Back rib by R  shoulder   ( winging)    Lie on L  side , pillow between knees and under head  Pull  R arm overhead over mattress, grab the edge of mattress,pull it upward, drawing elbow away from ears  Breathing 10 reps  Brushing arm with 3/4 turn onto pillow behind back  Lying on L  side ,Pillow/ Block between knees     dragging top forearm across ribs below breast rotating 3/4 turn,  rotating  _R_ only this week ,  relax onto the pillow behind the back  and then back to other palm , maintain top palm on body whole top and not lift shoulder  Do this side this week       Wait do both sides until we have levelled out your spine and shoulders  __   Restorative yoga -Reclined butterfly  Bolster with pillow folded under to create a ramp roled towed under neckand head  Butterfly legs, pillows under knees throw pillows under forearms 10 -15 min    __

## 2023-09-22 NOTE — Therapy (Signed)
 OUTPATIENT PHYSICAL THERAPY TREATMENT   Patient Name: Shamiyah Ngu MRN: [id number] DOB:1986/01/11, 38 y.o., female Today's Date: 09/22/2023   PT End of Session - 09/22/23 1115     Visit Number 4    Number of Visits 10    Date for PT Re-Evaluation 10/22/23    Authorization Type Medicaid    PT Start Time 1110    PT Stop Time 1158    PT Time Calculation (min) 48 min    Activity Tolerance Patient tolerated treatment well;No increased pain    Behavior During Therapy WFL for tasks assessed/performed             Past Medical History:  Diagnosis Date   FAP (familial adenomatous polyposis)    Hypothyroidism    Past Surgical History:  Procedure Laterality Date   ECTOPIC PREGNANCY SURGERY  2015   HAND SURGERY  2003   L fallopian tube removed      MANDIBLE FRACTURE SURGERY  2010   OVARIAN CYST REMOVAL Left 2014   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   PCP:   REFERRING PROVIDER: Raliegh Scarlet   REFERRING DIAG: R32 (ICD-10-CM) - Unspecified urinary incontinence  Rationale for Evaluation and Treatment Rehabilitation  THERAPY DIAG:  Other abnormalities of gait and mobility  Sacrococcygeal disorders, not elsewhere classified  Other lack of coordination  ONSET DATE:   SUBJECTIVE:       SUBJECTIVE STATEMENT  TODAY  Pt reports shoulder is improving.  Pt was sore after last session but it was better . Pt noticed she gets tired with with new exercises and she is able to squeeze her shoulder blades when driving                                                               SUBJECTIVE STATEMENT  EVAL  08/13/23:  1)  SUI:  laughing, coughing, yelling, working out    Denied urge incontinence  Daily BMs, denied straining, 70 % of the time Type 4 stool consistency , 30% of  time Type 3  90-100 fl oz of water   2) L shoulder discomfort when increasing weight or too many reps .  Pt had shoulder injury in 2024 due to incident in the gym with either too much weight or too many reps. Pt finished PT for 2-3 month for L shoulder pain.  NO more pain just discomfort at the head of shoulder socket.  No numbness / tingling.   Pt just to workout 6 days week, now is 3-4 week [facility].  Includes sit up and crunches,  No consistent stretches . 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   PLOF: IND   PATIENT GOALS:  Getting pelvic floor strengthened so she can jump, sneeze and not leakage    OBJECTIVE:   [facility] PT Assessment - 09/22/23 1115       Observation/Other Assessments   Observations L at T5,  R at T7      Palpation   SI assessment  shoulder levelled,    Palpation comment tightness and tenderness along posterior intercostals T7-8, medial scapula, cervical attachments to R scapula             [facility] Adult PT Treatment/Exercise - 09/22/23 1151       Therapeutic Activites    Therapeutic Activities Other Therapeutic Activities    Other Therapeutic Activities cued for log rolling to minimize strianing pelvic floor, cued for butterfly pose and principle of restorative yoga,      Neuro Re-ed    Neuro Re-ed Details  cued for scapular thoracic mobility HEP      Modalities   Modalities Moist  Heat      Moist Heat Therapy   Number Minutes Moist Heat 5 Minutes    Moist Heat Location --   during guided restorative yoga poses that will help promote lengthening of pelvic floor and adjusting to sleeping on back instead of belly,     Manual Therapy   Manual therapy comments STM/MWM at thoracic, medial scapular / intercostals R , C/ T junction                HOME EXERCISE PROGRAM: See pt instruction section    ASSESSMENT:  CLINICAL IMPRESSION:                    Pt showed good carry over with restored reach behind back with L shoulder and levelled shoulders but R scapular / intercostals were limited today. Pt required manual Tx to minimize these areas of restrictions. Pt demo'd improved mobliity post Tx. Required cues for log rolling and more stretches for maintaining mobility at these areas.  Provided restorative yoga pose that will help promote lengthening of pelvic floor and adjusting to sleeping on back instead of belly.   Plan to add resistance strengthening for cervico-scapular strengthening at next session to further achieve  upright posture.  These improvements will help promote optimize IAP system for improved pelvic floor function, trunk stability, gait, balance, stabilization with mobility tasks.     Regional interdependent approaches will yield greater benefits in pt's POC.  Plan to address pelvic floor issues once pelvis and spine are realigned to yield better outcomes.  Pt benefits from skilled PT.    OBJECTIVE IMPAIRMENTS decreased activity tolerance, decreased coordination, decreased endurance, decreased mobility, difficulty walking, decreased ROM, decreased strength, decreased safety awareness, hypomobility, increased muscle spasms, impaired flexibility, improper body mechanics, postural dysfunction, and pain. scar restrictions   ACTIVITY LIMITATIONS  self-care,   home chores, work tasks    PARTICIPATION LIMITATIONS:  community, gym activities     PERSONAL FACTORS        are also affecting patient's functional outcome.    REHAB POTENTIAL: Good   CLINICAL DECISION MAKING: Evolving/moderate complexity   EVALUATION COMPLEXITY: Moderate    PATIENT EDUCATION:    Education details: Showed pt  anatomy images. Explained muscles attachments/ connection, physiology of deep core system/ spinal- thoracic-pelvis-lower kinetic chain as they relate to pt's presentation, Sx, and past Hx. Explained what and how these areas of deficits need to be restored to balance and function    See Therapeutic activity / neuromuscular re-education section  Answered pt's questions.   Person educated: Patient Education method: Explanation, Demonstration, Tactile cues, Verbal cues, and Handouts Education comprehension: verbalized understanding, returned demonstration, verbal cues required, tactile cues required, and needs further education     PLAN: PT FREQUENCY: 1x/week   PT DURATION: 10 weeks   PLANNED INTERVENTIONS: Therapeutic exercises, Therapeutic activity, Neuromuscular re-education, Balance training, Gait training, Patient/Family education, Self Care, Joint mobilization, Spinal mobilization, Moist heat, Taping, and Manual therapy, dry needling.   PLAN FOR NEXT SESSION: See clinical impression for plan     GOALS: Goals reviewed with patient? Yes  SHORT TERM GOALS: Target date:  09/10/2023    Pt will demo IND with HEP                    Baseline: Not IND            Goal status: INITIAL   LONG TERM GOALS: Target date: 10/22/2023    1.Pt will demo proper deep core coordination without chest breathing and optimal excursion of diaphragm/pelvic floor in order to promote spinal stability and pelvic floor function  Baseline: dyscoordination Goal status: INITIAL  2.  Pt will demo proper body mechanics in against gravity tasks and ADLs  work tasks, fitness  to minimize straining pelvic floor / back    Baseline: not IND, improper form that  places strain on pelvic floor  Goal status: INITIAL    3. Pt will demo increased gait speed > 1.6 m/s with reciprocal gait pattern, longer stride length  in order to ambulate safely in community and return to fitness routine  Baseline: 1.48 m/s, limited R pelvic/ R shoulder  posterior rotation Goal status: INITIAL    4. Pt will demo levelled pelvic girdle and shoulder height in order to progress to deep core strengthening HEP and restore mobility at spine, pelvis, gait, posture minimize falls, and improve balance  Baseline: R shoulder / iliac crest lowered  Goal status: INITIAL   5. Pt will return to gym activities with no L shoulder discomfort with compliance to realignment HEP and deep mm strengthening HEP across 1 week Goal status: INITIAL  6. Pt will report no leakage episode across 1 week  when yelling, coughing, sneezing Baseline: leakage  Goal status: INITIAL    Mariane Masters, PT 09/22/2023, 12:04 PM

## 2023-09-24 ENCOUNTER — Ambulatory Visit: Payer: Medicaid Other | Admitting: Physical Therapy

## 2023-09-29 ENCOUNTER — Ambulatory Visit: Admitting: Physical Therapy

## 2023-10-01 ENCOUNTER — Ambulatory Visit: Payer: Medicaid Other | Admitting: Physical Therapy

## 2023-10-06 ENCOUNTER — Ambulatory Visit: Attending: Obstetrics and Gynecology | Admitting: Physical Therapy

## 2023-10-06 DIAGNOSIS — M533 Sacrococcygeal disorders, not elsewhere classified: Secondary | ICD-10-CM | POA: Diagnosis present

## 2023-10-06 DIAGNOSIS — R278 Other lack of coordination: Secondary | ICD-10-CM | POA: Diagnosis present

## 2023-10-06 DIAGNOSIS — R2689 Other abnormalities of gait and mobility: Secondary | ICD-10-CM | POA: Diagnosis present

## 2023-10-06 NOTE — Therapy (Signed)
 OUTPATIENT PHYSICAL THERAPY TREATMENT   Patient Name: Pamela Griffin MRN: [id number] DOB:1985-09-12, 38 y.o., female Today's Date: 10/06/2023   PT End of Session - 10/06/23 1112     Visit Number 5    Number of Visits 10    Date for PT Re-Evaluation 10/22/23    Authorization Type Medicaid    PT Start Time 1100    PT Stop Time 1145    PT Time Calculation (min) 45 min    Activity Tolerance Patient tolerated treatment well;No increased pain    Behavior During Therapy WFL for tasks assessed/performed             Past Medical History:  Diagnosis Date   FAP (familial adenomatous polyposis)    Hypothyroidism    Past Surgical History:  Procedure Laterality Date   ECTOPIC PREGNANCY SURGERY  2015   HAND SURGERY  2003   L fallopian tube removed      MANDIBLE FRACTURE SURGERY  2010   OVARIAN CYST REMOVAL Left 2014   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   PCP:   REFERRING PROVIDER: Raliegh Scarlet   REFERRING DIAG: R32 (ICD-10-CM) - Unspecified urinary incontinence  Rationale for Evaluation and Treatment Rehabilitation  THERAPY DIAG:  Other abnormalities of gait and mobility  Sacrococcygeal disorders, not elsewhere classified  Other lack of coordination  ONSET DATE:   SUBJECTIVE:       SUBJECTIVE STATEMENT  TODAY  Pt reports shoulder is improving and she did lots of lifting boxes while clearing out her storage unit.    SUBJECTIVE STATEMENT  EVAL  08/13/23:  1)  SUI:  laughing, coughing, yelling, working out    Denied urge incontinence  Daily BMs, denied straining, 70 % of the time Type 4 stool consistency , 30% of time Type 3  90-100 fl oz of water   2) L shoulder discomfort when increasing weight or too many reps .  Pt had shoulder injury in 2024 due to  incident in the gym with either too much weight or too many reps. Pt finished PT for 2-3 month for L shoulder pain.  NO more pain just discomfort at the head of shoulder socket.  No numbness / tingling.   Pt just to workout 6 days week, now is 3-4 week Burn [facility].  Includes sit up and crunches,  No consistent stretches . 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   PLOF: IND   PATIENT GOALS:  Getting pelvic floor strengthened so she can jump, sneeze and not leakage    OBJECTIVE:   [facility] PT Assessment - 10/06/23  1208       Coordination   Coordination and Movement Description overuse of oblique with coordination of resistance bands and posterior tilt of pelvis      Palpation   SI assessment  R shoulder slightly higher in seated    Palpation comment tightness along paraspinals medial to scapula             [facility] Adult PT Treatment/Exercise - 10/06/23 1209       Therapeutic Activites    Other Therapeutic Activities cued for log rolling to minimzie rounded shoudlers and back injuries      Neuro Re-ed    Neuro Re-ed Details  excessive cues for less posterior tilt of pelvis, more cervicoscapular retraction with band HEP      Manual Therapy   Manual therapy comments STM/MWM at paraspinals media to scapula R , C/ T junction                  HOME EXERCISE PROGRAM: See pt instruction section     ASSESSMENT:  CLINICAL IMPRESSION:                    Progressed pt to resistance strengthening for cervico-scapular strengthening to further achieve  upright posture.  Required excessive cues for less posterior tilt of pelvis, more cervicoscapular retraction with band HEP.  Pt performed correctly post Tx. Continued to require cue for log rolling to minimzie rounded shoulders/ back injuries, incontinence.   These improvements will help promote optimize IAP system for improved pelvic floor function, trunk stability, gait, balance, stabilization with mobility tasks.     Regional interdependent approaches will yield greater benefits in pt's POC.  Plan to address pelvic floor issues in upcoming sessions now than L shoulder has improved and pt has returned to ADLs.  Plan to add deep core training next session Pt benefits from skilled PT.    OBJECTIVE IMPAIRMENTS decreased activity tolerance, decreased coordination, decreased endurance, decreased mobility, difficulty walking, decreased ROM, decreased strength, decreased safety awareness, hypomobility, increased muscle spasms, impaired flexibility, improper body mechanics, postural dysfunction, and pain. scar restrictions   ACTIVITY LIMITATIONS  self-care,   home chores, work tasks    PARTICIPATION LIMITATIONS:  community, gym activities    PERSONAL FACTORS        are also affecting patient's functional outcome.    REHAB POTENTIAL: Good   CLINICAL DECISION MAKING: Evolving/moderate complexity   EVALUATION COMPLEXITY: Moderate    PATIENT EDUCATION:    Education details: Showed pt anatomy images. Explained muscles attachments/ connection, physiology of deep core system/ spinal- thoracic-pelvis-lower kinetic chain as they relate to pt's presentation, Sx, and past Hx. Explained what and how these areas of deficits need to be restored to balance and function    See Therapeutic activity / neuromuscular re-education section  Answered pt's  questions.   Person educated: Patient Education method: Explanation, Demonstration, Tactile cues, Verbal cues, and Handouts Education comprehension: verbalized understanding, returned demonstration, verbal cues required, tactile cues required, and needs further education     PLAN: PT FREQUENCY: 1x/week   PT DURATION: 10 weeks   PLANNED INTERVENTIONS: Therapeutic exercises, Therapeutic activity, Neuromuscular re-education, Balance training, Gait training, Patient/Family education, Self Care, Joint mobilization, Spinal mobilization, Moist heat, Taping, and Manual therapy, dry needling.   PLAN FOR NEXT SESSION: See clinical impression for plan     GOALS: Goals reviewed with patient? Yes  SHORT TERM GOALS: Target date:  09/10/2023    Pt will demo IND with HEP  Baseline: Not IND            Goal status: INITIAL   LONG TERM GOALS: Target date: 10/22/2023    1.Pt will demo proper deep core coordination without chest breathing and optimal excursion of diaphragm/pelvic floor in order to promote spinal stability and pelvic floor function  Baseline: dyscoordination Goal status: INITIAL  2.  Pt will demo proper body mechanics in against gravity tasks and ADLs  work tasks, fitness  to minimize straining pelvic floor / back    Baseline: not IND, improper form that places strain on pelvic floor  Goal status: INITIAL    3. Pt will demo increased gait speed > 1.6 m/s with reciprocal gait pattern, longer stride length  in order to ambulate safely in community and return to fitness routine  Baseline: 1.48 m/s, limited R pelvic/ R shoulder  posterior rotation Goal status: INITIAL    4. Pt will demo levelled pelvic girdle and shoulder height in order to progress to deep core strengthening HEP and restore mobility at spine, pelvis, gait, posture minimize falls, and improve balance  Baseline: R shoulder / iliac crest lowered  Goal status: INITIAL   5. Pt will return to  gym activities with no L shoulder discomfort with compliance to realignment HEP and deep mm strengthening HEP across 1 week Goal status: INITIAL  6. Pt will report no leakage episode across 1 week  when yelling, coughing, sneezing Baseline: leakage  Goal status: INITIAL    Mariane Masters, PT 10/06/2023, 12:13 PM

## 2023-10-06 NOTE — Patient Instructions (Addendum)
 Place band in "U"    band under ballmounds  while laying on back w/ knees bent   Lying on back, knees bent    band under ballmounds  while laying on back w/ knees bent  "W" exercise  10 reps x 2 sets   Band is placed under feet, knees bent, feet are hip width apart Hold band with thumbs point out, keep upper arm and elbow touching the bed the whole time  - inhale and then exhale pull bands by bending elbows hands move in a "w"  (feel shoulder blades squeezing)   20 reps ________________   Oblique/ scapula stabilization   Opposite arm   Place band in "U"    band under ballmounds  while laying on back w/ knees bent     20 reps  on each side  Holding band from opposite thigh,  Inhale,    exhale then pull band across body while keeping elbow , shoulders, back of the head pressed down     ______________    Avoid straining pelvic floor, abdominal muscles , spine  Use log rolling technique instead of getting out of bed with your neck or the sit-up     Log rolling into and out of bed   Log rolling into and out of bed If getting out of bed on R side, Bent knees, scoot hips/ shoulder to L  Raise R arm completely overhead, rolling onto armpit  Then lower bent knees to bed to get into complete side lying position  Then drop legs off bed, and push up onto R elbow/forearm, and use L hand to push onto the bed    Dig elbows and feet to lift hte buttocks and scoot without lifting head

## 2023-10-08 ENCOUNTER — Ambulatory Visit: Payer: Medicaid Other | Admitting: Physical Therapy

## 2023-10-13 ENCOUNTER — Ambulatory Visit: Admitting: Physical Therapy

## 2023-10-13 ENCOUNTER — Encounter: Payer: Self-pay | Admitting: Physical Therapy

## 2023-10-13 DIAGNOSIS — M533 Sacrococcygeal disorders, not elsewhere classified: Secondary | ICD-10-CM

## 2023-10-13 DIAGNOSIS — R2689 Other abnormalities of gait and mobility: Secondary | ICD-10-CM

## 2023-10-13 DIAGNOSIS — R278 Other lack of coordination: Secondary | ICD-10-CM

## 2023-10-13 NOTE — Therapy (Signed)
 OUTPATIENT PHYSICAL THERAPY TREATMENT  / Recert   Patient Name: Pamela Griffin MRN: [id number] DOB:[birth year], 38 y.o., female Today's Date: 10/13/2023   PT End of Session - 10/13/23 0936     Visit Number 6    Number of Visits 16    Date for PT Re-Evaluation 12/22/23    Authorization Type Medicaid 11 visits between 2/19 - 11/17/23    Authorization - Visit Number 5    Authorization - Number of Visits 11    PT Start Time 0934    PT Stop Time 1015    PT Time Calculation (min) 41 min    Activity Tolerance Patient tolerated treatment well;No increased pain    Behavior During Therapy WFL for tasks assessed/performed             Past Medical History:  Diagnosis Date   FAP (familial adenomatous polyposis)    Hypothyroidism    Past Surgical History:  Procedure Laterality Date   ECTOPIC PREGNANCY SURGERY  2015   HAND SURGERY  2003   L fallopian tube removed      MANDIBLE FRACTURE SURGERY  2010   OVARIAN CYST REMOVAL Left 2014   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   PCP:   REFERRING PROVIDER: Sherryll Donald   REFERRING DIAG: R32 (ICD-10-CM) - Unspecified urinary incontinence  Rationale for Evaluation and Treatment Rehabilitation  THERAPY DIAG:  Other abnormalities of gait and mobility  Sacrococcygeal disorders, not elsewhere classified  Other lack of coordination  ONSET DATE:   SUBJECTIVE:       SUBJECTIVE STATEMENT  TODAY  Pt reports shoulder is improving and felt no pain last week   SUBJECTIVE STATEMENT  EVAL  08/13/23:  1)  SUI:  laughing, coughing, yelling, working out    Denied urge incontinence  Daily BMs, denied straining, 70 % of the time Type 4 stool consistency , 30% of time Type 3  90-100 fl oz of water   2) L shoulder discomfort when  increasing weight or too many reps .  Pt had shoulder injury in 2024 due to incident in the gym with either too much weight or too many reps. Pt finished PT for 2-3 month for L shoulder pain.  NO more pain just discomfort at the head of shoulder socket.  No numbness / tingling.   Pt just to workout 6 days week, now is 3-4 week Burn [facility].  Includes sit up and crunches,  No consistent stretches . 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   PLOF: IND   PATIENT GOALS:  Getting pelvic floor strengthened so  she can jump, sneeze and not leakage    OBJECTIVE:     [facility] PT Assessment - 10/13/23 1041       Observation/Other Assessments   Observations L at T5,  R at T5      Coordination   Coordination and Movement Description overuse of oblique with deep core level 1      Palpation   Spinal mobility deviated and hypomobile T10-11 L, intercostal posterior and anterior rib tightness, medial scapulat tightness    SI assessment  R shoulder slightly higher in seated             [facility] Adult PT Treatment/Exercise - 10/13/23 1043       Self-Care   Other Self-Care Comments  reassessed goals for recrt      Neuro Re-ed    Neuro Re-ed Details  excessive cues for deep core level 1 with less ab overuse,  cued for new streches to improve diaphramgatic excursion and minimize deviations at T/ L junction, cued for gait longer stride  and more anterior COM      Manual Therapy   Manual therapy comments PA mob Grade III, rotational mob, STM/MWM at problem areas noted in assessment to realign spine and minimize tightness               HOME EXERCISE PROGRAM: See pt instruction section    ASSESSMENT:  CLINICAL IMPRESSION:  Pt has partially met one goal with structural alignment but still requires skilled PT for remaining goals.    L shoulder pain is improving and pt noticed no pain last week. Pt has regained shoulder ROM with reach behind back and abduction  Pt demo'd levelled pelvis but spine and shoulder alignment still requires manual Tx and HEP to realign   These improvements will help promote optimize IAP system for improved pelvic floor function, trunk stability, gait, balance, stabilization with mobility tasks.  Further manual Tx was required today to realign T/L junction for optimal diaphragmatic excursion / scapular mobility. Advanced to deep core level 1 with cues for less ab overuse.  Cued for gait mechanics .      Regional interdependent approaches will yield greater benefits in pt's POC.  Plan to address pelvic floor issues in upcoming sessions now than L shoulder has improved and pt has returned to ADLs.  Plan to add deep core level 2  next session Pt benefits from skilled PT. Anticipate pt will achieve remaining goals.     OBJECTIVE IMPAIRMENTS decreased activity tolerance, decreased coordination, decreased endurance, decreased mobility, difficulty walking, decreased ROM, decreased strength, decreased safety awareness, hypomobility, increased muscle spasms, impaired flexibility, improper body mechanics, postural dysfunction, and pain. scar restrictions   ACTIVITY LIMITATIONS  self-care,   home chores, work tasks    PARTICIPATION LIMITATIONS:  community, gym activities    PERSONAL FACTORS        are also affecting patient's functional outcome.    REHAB POTENTIAL: Good   CLINICAL DECISION  MAKING: Evolving/moderate complexity   EVALUATION COMPLEXITY: Moderate    PATIENT EDUCATION:    Education details: Showed pt anatomy images. Explained muscles attachments/ connection, physiology of deep core system/ spinal- thoracic-pelvis-lower kinetic chain as they relate to pt's presentation, Sx, and past Hx. Explained what and how these areas of deficits need to be restored to balance and function    See Therapeutic activity / neuromuscular re-education section  Answered pt's questions.   Person educated: Patient Education method: Explanation, Demonstration, Tactile cues, Verbal cues, and Handouts  Education comprehension: verbalized understanding, returned demonstration, verbal cues required, tactile cues required, and needs further education     PLAN: PT FREQUENCY: 1x/week   PT DURATION: 10 weeks   PLANNED INTERVENTIONS: Therapeutic exercises, Therapeutic activity, Neuromuscular re-education, Balance training, Gait training, Patient/Family education, Self Care, Joint mobilization, Spinal mobilization, Moist heat, Taping, and Manual therapy, dry needling.   PLAN FOR NEXT SESSION: See clinical impression for plan     GOALS: Goals reviewed with patient? Yes  SHORT TERM GOALS: Target date:  09/10/2023    Pt will demo IND with HEP                    Baseline: Not IND            Goal status: MET    LONG TERM GOALS: Target date: 12/22/2023    1.Pt will demo proper deep core coordination without chest breathing and optimal excursion of diaphragm/pelvic floor in order to promote spinal stability and pelvic floor function  Baseline: dyscoordination Goal status: Ongoing   2.  Pt will demo proper body mechanics in against gravity tasks and ADLs  work tasks, fitness  to minimize straining pelvic floor / back    Baseline: not IND, improper form that places strain on pelvic floor  Goal status: Ongoing     3. Pt will demo increased gait speed > 1.6 m/s with reciprocal gait  pattern, longer stride length  in order to ambulate safely in community and return to fitness routine  Baseline: 1.48 m/s, limited R pelvic/ R shoulder  posterior rotation Goal status: Ongoing     4. Pt will demo levelled pelvic girdle and shoulder height in order to progress to deep core strengthening HEP and restore mobility at spine, pelvis, gait, posture minimize falls, and improve balance  Baseline: R shoulder / iliac crest lowered  Goal status:Partially Met ( 10/13/23:  levelled pelvis , R shoulder still higher)    5. Pt will return to gym activities with no L shoulder discomfort with compliance to realignment HEP and deep mm strengthening HEP across 1 week Goal status: Ongoing   6. Pt will report no leakage episode across 1 week  when yelling, coughing, sneezing Baseline: leakage  Goal status:  Ongoing  ( 10/13/23:  Pt reports it is less but still worries leakage will occur with sneezing)     Modesto Andreas, PT 10/13/2023, 9:44 AM

## 2023-10-13 NOTE — Patient Instructions (Signed)
            Work stretches:     Mudlogger,    Palms shoulder width apart  Minisquat postion Trunk is parallel to floor   A) Pull buttocks back to lengthen spine, knees bent  3 breaths    B) Bring R hand to the L, and stretch the R side trunk  3 breaths    Brings hands to center again Do the same to the L side stretch by placing L hand on top of R    C)  Hand on R hip ,  R elbow back and twist to look up at ceiling R   Do the same to other side       Home:       A) Childs pose rocking    Toes tucked, shoulders down and back, on forearms , hands shoulder width apart, fingers straight, elbow back , squeeze imaginary pencils in armpit, shoulder down and away from ears   10 reps      B) 3 way childs pose    - R hand by L hand for R side stretch    - other side  3 breath each         C)  TABLE TOP POSITION Hand on R hip ,  R elbow back and twist to look up at ceiling R   Do the same to other side    Hands behind back   Minisquat Shift knees forward, chest raise and lifts up to sky Gaze up to sky as you pulls hands down                         Deep core level 1 ( handout) 10 breaths in the morning and evening

## 2023-10-22 ENCOUNTER — Encounter: Admitting: Physical Therapy

## 2023-10-22 ENCOUNTER — Ambulatory Visit: Admitting: Physical Therapy

## 2023-11-13 ENCOUNTER — Ambulatory Visit: Attending: Obstetrics and Gynecology | Admitting: Physical Therapy

## 2023-11-13 DIAGNOSIS — R2689 Other abnormalities of gait and mobility: Secondary | ICD-10-CM | POA: Diagnosis present

## 2023-11-13 DIAGNOSIS — M533 Sacrococcygeal disorders, not elsewhere classified: Secondary | ICD-10-CM | POA: Insufficient documentation

## 2023-11-13 DIAGNOSIS — R278 Other lack of coordination: Secondary | ICD-10-CM | POA: Diagnosis present

## 2023-11-13 NOTE — Patient Instructions (Addendum)
  Proper body mechanics with getting out of a chair to decrease strain  on back &pelvic floor   Avoid holding your breath when Getting out of the chair:  Scoot to front part of chair chair Heels behind knees, feet are hip width apart, nose over toes  Inhale like you are smelling roses Exhale to stand    __  Minisquat: Scoot buttocks back slight, hinge like you are looking at your reflection on a pond  Knees behind toes,  Inhale to "smell flowers"  Exhale on the rise "like rocket"  Do not lock knees, have more weight across ballmounds of feet, toes relaxed and spread them, not grip them , feet and heels slightly turned out   10 reps x 3 x day   __  Turning body 45 deg to sink, unlocked knees, as another way to stand with less back pain while doing dishes and while lowering / bend and lift children out of strollers   __  Standing with 8 points of contact not locking knees   __

## 2023-11-13 NOTE — Therapy (Addendum)
 OUTPATIENT PHYSICAL THERAPY TREATMENT     Patient Name: Pamela Griffin MRN: [id number] DOB:01-01-1986, 38 y.o., female Today's Date: 11/13/2023   PT End of Session - 11/13/23 1124     Visit Number 7    Number of Visits 16    Date for PT Re-Evaluation 01/22/24   Authorization Type Medicaid 11 visits between 2/19 - 11/17/23    Authorization - Visit Number 6    Authorization - Number of Visits 11    PT Start Time 1105    PT Stop Time 1145    PT Time Calculation (min) 40 min    Activity Tolerance Patient tolerated treatment well;No increased pain    Behavior During Therapy WFL for tasks assessed/performed             Past Medical History:  Diagnosis Date   FAP (familial adenomatous polyposis)    Hypothyroidism    Past Surgical History:  Procedure Laterality Date   ECTOPIC PREGNANCY SURGERY  2015   HAND SURGERY  2003   L fallopian tube removed      MANDIBLE FRACTURE SURGERY  2010   OVARIAN CYST REMOVAL Left 2014   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   PCP:   REFERRING PROVIDER: Alois Ozell BIRCH   REFERRING DIAG: R32 (ICD-10-CM) - Unspecified urinary incontinence  Rationale for Evaluation and Treatment Rehabilitation  THERAPY DIAG:  Other abnormalities of gait and mobility  Sacrococcygeal disorders, not elsewhere classified  Other lack of coordination  ONSET DATE:   SUBJECTIVE:       SUBJECTIVE STATEMENT  TODAY  Pt reports shoulder is improving and felt no pain last week   SUBJECTIVE STATEMENT  EVAL  08/13/23:  1)  SUI:  laughing, coughing, yelling, working out    Denied urge incontinence  Daily BMs, denied straining, 70 % of the time Type 4 stool consistency , 30% of time Type 3  90-100 fl oz of water   2) L shoulder discomfort when increasing  weight or too many reps .  Pt had shoulder injury in 2024 due to incident in the gym with either too much weight or too many reps. Pt finished PT for 2-3 month for L shoulder pain.  NO more pain just discomfort at the head of shoulder socket.  No numbness / tingling.   Pt just to workout 6 days week, now is 3-4 week [facility].  Includes sit up and crunches,  No consistent stretches . 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  With cues for transverse arch coactivation/ heel, unlocked knees,  16 reps on R,    Palpation: levelled pelvis and shoulders   Gait:          [facility] Adult PT Treatment/Exercise - 11/13/23 1129       Therapeutic Activites    Other Therapeutic Activities explained principles of stbailizaiton, feet and alignment and knee position for pelvic girdle stability and minimize LBP , principles with against gravity tasks, and standing posture to not lock knees.      Neuro Re-ed    Neuro Re-ed Details   With cues for transverse arch coactivation/ heel, unlocked knees, in SLS, cued for propoioception of knee and feet, pelvis in squats and  alignment,  cued for sit to stand technique to minimize overactive pelvic floor               HOME EXERCISE PROGRAM: See pt instruction section    ASSESSMENT:  CLINICAL IMPRESSION:  Pt has met three  goals with structural alignment but still requires skilled PT for remaining goals.   Shoulder and pelvis and spine have regained central alignment.  Pt has regained shoulder ROM with reach behind back and abduction  Lower kinetic chain is improving to address adducted knees, hip IR,  and supination / adducted feet and tendency to hyperextended knees which impact fitness positions such as squats and balance and impacting overuse of pelvic floor related incontinence.  Pt required cues for transverse arch coactivation/ heel, unlocked knees, in SLS, cued for propoioception of knee and feet, pelvis in squats and  alignment,  cued for sit to stand technique to minimize overactive pelvic floor.  Cued for squats in fi tness and how to engage in [facility] and alignment to minimize overactivity pelvic floor and minimize back strain.   These improvements will help promote optimize IAP system for improved pelvic floor function, trunk stability, gait, balance, stabilization with mobility tasks.   Plan to add deep core level 2 training next session.  Pelvic floor assessment is planned for upcoming visits and progress to kegels if needed.  Plan to add more fitness training and modifications and propioception training in upcoming sessions to minmize relapse of LBP, and further improve incontinence. Pt requested a letter to her gym to pause her membership for Burn [facility] classes for another 3 months because pt would like to continue with Pelvic PT and not perform the exercises in those classes to improve continence.      Regional interdependent approaches will yield greater  benefits in pt's POC.      Pt's schedules with children and limited opening in therapist's schedule have been a barrier to progress. Pt 's schedule is more available with children being out of school but therapist's schedule is limited and is scheduling pt in with cancellation openings.  Pt benefits from skilled PT. Anticipate pt will achieve remaining goals.     OBJECTIVE IMPAIRMENTS decreased activity tolerance, decreased coordination, decreased endurance, decreased mobility, difficulty walking, decreased ROM, decreased strength, decreased safety awareness, hypomobility, increased muscle spasms, impaired flexibility, improper body mechanics, postural  dysfunction, and pain. scar restrictions   ACTIVITY LIMITATIONS  self-care,   home chores, work tasks    PARTICIPATION LIMITATIONS:  community, gym activities    PERSONAL FACTORS        are also affecting patient's functional outcome.    REHAB POTENTIAL: Good   CLINICAL DECISION MAKING: Evolving/moderate complexity   EVALUATION COMPLEXITY: Moderate    PATIENT EDUCATION:    Education details: Showed pt anatomy images. Explained muscles attachments/ connection, physiology of deep core system/ spinal- thoracic-pelvis-lower kinetic chain as they relate to pt's presentation, Sx, and past Hx. Explained what and how these areas of deficits need to be restored to balance and function    See Therapeutic activity / neuromuscular re-education section  Answered pt's questions.   Person educated: Patient Education method: Explanation, Demonstration, Tactile cues, Verbal cues, and Handouts Education comprehension: verbalized understanding, returned demonstration, verbal cues required, tactile cues required, and needs further education     PLAN: PT FREQUENCY: 1x/week   PT DURATION: 10 weeks   PLANNED INTERVENTIONS: Therapeutic exercises, Therapeutic activity, Neuromuscular re-education, Balance training, Gait training, Patient/Family education,  Self Care, Joint mobilization, Spinal mobilization, Moist heat, Taping, and Manual therapy, dry needling.   PLAN FOR NEXT SESSION: See clinical impression for plan     GOALS: Goals reviewed with patient? Yes  SHORT TERM GOALS: Target date:  09/10/2023    Pt will demo IND with HEP                    Baseline: Not IND            Goal status: MET    LONG TERM GOALS: Target date: 01/22/24    1.Pt will demo proper deep core coordination without chest breathing and optimal excursion of diaphragm/pelvic floor in order to promote spinal stability and pelvic floor function  Baseline: dyscoordination Goal status: MET   2.  Pt will demo proper body mechanics in against gravity tasks and ADLs  work tasks, fitness  to minimize straining pelvic floor / back    Baseline: not IND, improper form that places strain on pelvic floor  Goal status: Ongoing ( 11/13/23: pt has not returned to [employer] classes)     3. Pt will demo increased gait speed > 1.6 m/s with reciprocal gait pattern, longer stride length  in order to ambulate safely in community and return to fitness routine  Baseline: 1.48 m/s, limited R pelvic/ R shoulder  posterior rotation Goal status: MET ( 1.77 m/s, reciprocal)     4. Pt will demo levelled pelvic girdle and shoulder height in order to progress to deep core strengthening HEP and restore mobility at spine, pelvis, gait, posture minimize falls, and improve balance  Baseline: R shoulder / iliac crest lowered  Goal status:MET     5. Pt will return to gym activities with no L shoulder discomfort with compliance to realignment HEP and deep mm strengthening HEP across 1 week Goal status: Ongoing   6. Pt will report no leakage episode across 1 week  when yelling, coughing, sneezing Baseline: leakage  Goal status:  Ongoing  ( 11/13/23:  Pt reports it is less but still worries leakage will occur with sneezing)     Pia Lupe Plump, PT 11/13/2023, 11:42 AM

## 2023-12-04 ENCOUNTER — Ambulatory Visit: Admitting: Physical Therapy

## 2023-12-07 ENCOUNTER — Ambulatory Visit
Admission: RE | Admit: 2023-12-07 | Discharge: 2023-12-07 | Disposition: A | Discharge status: Home / Self Care | Source: Ambulatory Visit

## 2023-12-07 NOTE — ED Triage Notes (Signed)
 Pt presents to UC for c/o severe rectal itching x4 days. Pt took one pill of pyrantal pamate which was prescribed by another UC but pt states it only relieved some of the itching.  Reports nausea, runny nose, body aches x4 days.

## 2023-12-10 ENCOUNTER — Ambulatory Visit: Admitting: Physical Therapy

## 2023-12-18 ENCOUNTER — Ambulatory Visit: Attending: Obstetrics and Gynecology | Admitting: Physical Therapy

## 2023-12-18 DIAGNOSIS — M533 Sacrococcygeal disorders, not elsewhere classified: Secondary | ICD-10-CM | POA: Diagnosis present

## 2023-12-18 DIAGNOSIS — R278 Other lack of coordination: Secondary | ICD-10-CM | POA: Diagnosis present

## 2023-12-18 DIAGNOSIS — R2689 Other abnormalities of gait and mobility: Secondary | ICD-10-CM | POA: Insufficient documentation

## 2023-12-18 NOTE — Therapy (Signed)
 OUTPATIENT PHYSICAL THERAPY TREATMENT     Patient Name: Adriane Gabbert MRN: [id number] DOB:10-Feb-1986, 38 y.o., female Today's Date: 12/18/2023   PT End of Session - 12/18/23 0812     Visit Number 8    Number of Visits 16    Date for PT Re-Evaluation 01/22/24   Authorization Type Medicaid 11 visits between 2/19 - 11/17/23    Authorization - Number of Visits 11    PT Start Time 0804    PT Stop Time 0845    PT Time Calculation (min) 41 min    Activity Tolerance Patient tolerated treatment well;No increased pain    Behavior During Therapy WFL for tasks assessed/performed          Past Medical History:  Diagnosis Date   FAP (familial adenomatous polyposis)    Hypothyroidism    Past Surgical History:  Procedure Laterality Date   ECTOPIC PREGNANCY SURGERY  2015   HAND SURGERY  2003   L fallopian tube removed      MANDIBLE FRACTURE SURGERY  2010   OVARIAN CYST REMOVAL Left 2014   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   PCP:   REFERRING PROVIDER: Alois Ozell BIRCH   REFERRING DIAG: R32 (ICD-10-CM) - Unspecified urinary incontinence  Rationale for Evaluation and Treatment Rehabilitation  THERAPY DIAG:  Other abnormalities of gait and mobility  Sacrococcygeal disorders, not elsewhere classified  Other lack of coordination  ONSET DATE:   SUBJECTIVE:       SUBJECTIVE STATEMENT  TODAY  Pt reports no more shoulder pain but sometimes an ache  Pt notices she does not have to cross her legs before sneezing     SUBJECTIVE STATEMENT  EVAL  08/13/23:  1)  SUI:  laughing, coughing, yelling, working out    Denied urge incontinence  Daily BMs, denied straining, 70 % of the time Type 4 stool consistency , 30% of time Type 3  90-100 fl oz of water   2) L shoulder  discomfort when increasing weight or too many reps .  Pt had shoulder injury in 2024 due to incident in the gym with either too much weight or too many reps. Pt finished PT for 2-3 month for L shoulder pain.  NO more pain just discomfort at the head of shoulder socket.  No numbness / tingling.   Pt just to workout 6 days week, now is 3-4 week [facility].  Includes sit up and crunches,  No consistent stretches . 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   PLOF: IND   PATIENT GOALS:  Getting pelvic floor strengthened  so she can jump, sneeze and not leakage    OBJECTIVE:    [facility] PT Assessment - 12/18/23 0833       Coordination   Coordination and Movement Description overuse of oblique with deep core level 1 and 2,  dycoordinaiton w/ pelvic floor, posterior tilt of pelvis , poor propicoeption of pelvic and LKC      Palpation   SI assessment  levelled shoulder/pelvis           Pelvic Floor Special Questions - 12/18/23 1048     External Perineal Exam through clothing, no tigthtness B            [facility] Adult PT Treatment/Exercise - 12/18/23 0846       Self-Care   Other Self-Care Comments  explained upcoming visits for pelvic floor assessment, determine whether kegel training and two types of  contractions ,  showed anatomy images of 3 layers of pelvic floor)      Neuro Re-ed    Neuro Re-ed Details  excessive  cues for deep core level 1-2 ( with ltactile and verbal cues for less ab overuse and pelvic perturbation to minimize downward forces onto pelvic floor)   Tactile and verbal cues for anterior  tilt of pelvis , poor propicoeption of pelvic and LKC and optimal lengthening of pelvic floor and upward movement               HOME EXERCISE PROGRAM: See pt instruction section    ASSESSMENT:  CLINICAL IMPRESSION:  Pt has met three  goals with structural alignment but still requires skilled PT for remaining goals.   Shoulder and pelvis and spine have regained central alignment.  Pt has regained shoulder ROM with reach behind back and abduction.  Pt has stopped sleeping on her belly which is helping with her maintaining equal alignment of shoulder and pelvis. She is able to sleep on her back throughout the nights more often. Lower kinetic chain is improving to address adducted knees, hip IR,  and supination / adducted feet and tendency to hyperextended knees which impact fitness positions such as squats and balance and impacting overuse of pelvic floor related incontinence.  Pt required cues for transverse arch coactivation/ heel, unlocked knees, in SLS, cued for propoioception of knee and feet, pelvis in squats and  alignment,  cued for sit to stand technique to minimize overactive pelvic floor.  Cued for squats in fi tness and how to engage in [facility] and alignment to minimize overactivity pelvic floor and minimize back strain.    These improvements will help promote optimize IAP system for improved pelvic floor function, trunk stability, gait, balance, stabilization with mobility tasks.   Progressed to deep core level 2 training today but required  tactile and verbal cues for anterior  tilt of pelvis , poor propicoeption of pelvic and LKC and optimal lengthening of pelvic floor and upward movement in coordination with deep core and less overuse of abs.   External pelvic floor assessment through  clothing showed no tightness. Plan to  progress to kegels next session. Explained  pelvic floor assessment, determine whether kegel training and two types of  contractions ,  showed anatomy images of 3 layers of pelvic floor)    Plan to add more fitness training and modifications and propioception training in upcoming sessions to minmize relapse of LBP, and further improve incontinence. Pt requested a letter to her gym to pause her membership for Burn [facility] classes for another 3 months because  pt would like to continue with Pelvic PT and not perform the exercises in those classes to improve continence.      Regional interdependent approaches will yield greater benefits in pt's POC.      Pt's schedules with children and limited opening in therapist's schedule have been a barrier to progress. Pt 's schedule is more available with children being out of school but therapist's schedule is limited and is scheduling pt in with cancellation openings.  Pt benefits from skilled PT. Anticipate pt will achieve remaining goals.     OBJECTIVE IMPAIRMENTS decreased activity tolerance, decreased coordination, decreased endurance, decreased mobility, difficulty walking, decreased ROM, decreased strength, decreased safety awareness, hypomobility, increased muscle spasms, impaired flexibility, improper body mechanics, postural dysfunction, and pain. scar restrictions   ACTIVITY LIMITATIONS  self-care,   home chores, work tasks    PARTICIPATION LIMITATIONS:  community, gym activities    PERSONAL FACTORS        are also affecting patient's functional outcome.    REHAB POTENTIAL: Good   CLINICAL DECISION MAKING: Evolving/moderate complexity   EVALUATION COMPLEXITY: Moderate    PATIENT EDUCATION:    Education details: Showed pt anatomy images. Explained muscles attachments/ connection, physiology of deep core system/ spinal- thoracic-pelvis-lower kinetic chain as they relate to pt's presentation, Sx, and  past Hx. Explained what and how these areas of deficits need to be restored to balance and function    See Therapeutic activity / neuromuscular re-education section  Answered pt's questions.   Person educated: Patient Education method: Explanation, Demonstration, Tactile cues, Verbal cues, and Handouts Education comprehension: verbalized understanding, returned demonstration, verbal cues required, tactile cues required, and needs further education     PLAN: PT FREQUENCY: 1x/week   PT DURATION: 10 weeks   PLANNED INTERVENTIONS: Therapeutic exercises, Therapeutic activity, Neuromuscular re-education, Balance training, Gait training, Patient/Family education, Self Care, Joint mobilization, Spinal mobilization, Moist heat, Taping, and Manual therapy, dry needling.   PLAN FOR NEXT SESSION: See clinical impression for plan     GOALS: Goals reviewed with patient? Yes  SHORT TERM GOALS: Target date:  09/10/2023    Pt will demo IND with HEP                    Baseline: Not IND            Goal status: MET    LONG TERM GOALS: Target date: 01/22/24    1.Pt will demo proper deep core coordination without chest breathing and optimal excursion of diaphragm/pelvic floor in order to promote spinal stability and pelvic floor function  Baseline: dyscoordination Goal status: MET   2.  Pt will demo proper body mechanics in against gravity tasks and ADLs  work tasks, fitness  to minimize straining pelvic floor / back    Baseline: not IND, improper form that places strain on pelvic floor  Goal status: Ongoing ( 11/13/23: pt has not returned to [place] camp classes) ( 12/18/23:  upcoming sessions to focus on fitness modifications)     3. Pt will demo increased gait speed > 1.6 m/s with reciprocal gait pattern, longer stride length  in order to ambulate safely in community and return to fitness routine  Baseline: 1.48 m/s, limited R pelvic/ R shoulder  posterior rotation Goal status: MET ( 1.77  m/s, reciprocal)     4. Pt will demo levelled pelvic girdle and shoulder height in order to progress to deep core strengthening HEP and restore mobility at spine, pelvis, gait, posture  minimize falls, and improve balance  Baseline: R shoulder / iliac crest lowered  Goal status:MET     5. Pt will return to gym activities with no L shoulder discomfort with compliance to realignment HEP and deep mm strengthening HEP across 1 week Goal status: Ongoing   6. Pt will report no leakage episode across 1 week  when yelling, coughing, sneezing Baseline: leakage  Goal status:  partially met   ( 11/13/23:  Pt reports it is less but still worries leakage will occur with sneezing)  (12/18/23: not as frequent, and having to cross her legs)     Pia Lupe Plump, PT 12/18/2023, 8:29 AM

## 2023-12-25 ENCOUNTER — Ambulatory Visit: Admitting: Physical Therapy

## 2023-12-25 DIAGNOSIS — R2689 Other abnormalities of gait and mobility: Secondary | ICD-10-CM

## 2023-12-25 DIAGNOSIS — R278 Other lack of coordination: Secondary | ICD-10-CM

## 2023-12-25 DIAGNOSIS — M533 Sacrococcygeal disorders, not elsewhere classified: Secondary | ICD-10-CM

## 2023-12-25 NOTE — Therapy (Signed)
 OUTPATIENT PHYSICAL THERAPY TREATMENT     Patient Name: Pamela Griffin MRN: [id number] DOB:08/26/1985, 38 y.o., female Today's Date: 12/25/2023   PT End of Session - 12/25/23 0814     Visit Number 9    Number of Visits 16    Date for PT Re-Evaluation 01/22/24    Authorization Type Medicaid 11 visits between 2/19 - 11/17/23    Authorization - Number of Visits 11    PT Start Time 0812    PT Stop Time 0850    PT Time Calculation (min) 38 min    Activity Tolerance Patient tolerated treatment well;No increased pain    Behavior During Therapy WFL for tasks assessed/performed          Past Medical History:  Diagnosis Date   FAP (familial adenomatous polyposis)    Hypothyroidism    Past Surgical History:  Procedure Laterality Date   ECTOPIC PREGNANCY SURGERY  2015   HAND SURGERY  2003   L fallopian tube removed      MANDIBLE FRACTURE SURGERY  2010   OVARIAN CYST REMOVAL Left 2014   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   PCP:   REFERRING PROVIDER: Alois Ozell BIRCH   REFERRING DIAG: R32 (ICD-10-CM) - Unspecified urinary incontinence  Rationale for Evaluation and Treatment Rehabilitation  THERAPY DIAG:  Other abnormalities of gait and mobility  Sacrococcygeal disorders, not elsewhere classified  Other lack of coordination  ONSET DATE:   SUBJECTIVE:       SUBJECTIVE STATEMENT  TODAY  Pt reports no more shoulder pain but sometimes an ache  Pt notices she does not have to cross her legs before sneezing     SUBJECTIVE STATEMENT  EVAL  08/13/23:  1)  SUI:  laughing, coughing, yelling, working out    Denied urge incontinence  Daily BMs, denied straining, 70 % of the time Type 4 stool consistency , 30% of time Type 3  90-100 fl oz of water   2) L shoulder  discomfort when increasing weight or too many reps .  Pt had shoulder injury in 2024 due to incident in the gym with either too much weight or too many reps. Pt finished PT for 2-3 month for L shoulder pain.  NO more pain just discomfort at the head of shoulder socket.  No numbness / tingling.   Pt just to workout 6 days week, now is 3-4 week [facility].  Includes sit up and crunches,  No consistent stretches . 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   PLOF: IND   PATIENT GOALS:  Getting pelvic floor  strengthened so she can jump, sneeze and not leakage    OBJECTIVE:     Pelvic Floor Special Questions - 12/25/23 9173     External Perineal Exam through clothing, L anterior mm tightness, ab straining, dyscoordination of pelvic floor, limited upward movement of pelvic floor with exhalation, tendency to be in posterior tilt of pelvis           [facility] Adult PT Treatment/Exercise - 12/25/23 9161       Self-Care   Other Self-Care Comments  discussed consolidating HEP      Neuro Re-ed    Neuro Re-ed Details  excessive cues for propioception of pelvis in anterior tilt with deep core to promote more upward movement of pelvic floor on exhalation, excessive cues for cervico/scapular retraction in prone exercises to minbimize lumbar lordosis and to promote more les forward head posture      Manual Therapy   Manual therapy  comments external technique:  STM/MWM at L anterior mm L to promote mobility and lengthening of pelvic floor iwith deep core            HOME EXERCISE PROGRAM: See pt instruction section    ASSESSMENT:  CLINICAL IMPRESSION:  Pt has met three  goals with structural alignment but still requires skilled PT for remaining goals.   Shoulder and pelvis and spine have regained central alignment.  Pt has regained shoulder ROM with reach behind back and abduction.  Pt has stopped sleeping on her belly which is helping with her maintaining equal alignment of shoulder and pelvis. She is able to sleep on her back throughout the nights more often. Lower kinetic chain is improving to address adducted knees, hip IR,  and supination / adducted feet and tendency to hyperextended knees which impact fitness positions such as squats and balance and impacting overuse of pelvic floor related incontinence.  Pt required cues for transverse arch coactivation/ heel, unlocked knees, in SLS, cued for propoioception of knee and feet, pelvis in squats and  alignment,  cued for sit to stand technique to minimize overactive pelvic floor.  Cued for squats in fi tness and how to engage in [facility] and alignment to minimize overactivity pelvic floor and minimize back strain.    These improvements will help promote optimize IAP system for improved pelvic floor function, trunk stability, gait, balance, stabilization with mobility tasks.   Reviewed deep core level 1-2 today but required  tactile and verbal cues for anterior  tilt of pelvis , poor propicoeption of pelvic. Pt also required external manual Tx to minimize L anterior pelvic floor tightness. Pt demo'd improve mobility and lengthening of pelvic floor post Tx which will help with continence.     Plan to add more fitness training and modifications and propioception training in upcoming sessions to minmize relapse of LBP, and further improve incontinence. Pt requested a  letter to her gym to pause her membership for [facility] classes for another 3 months because pt would like to continue with Pelvic PT and not perform the exercises in those classes to improve continence.      Regional interdependent approaches will yield greater benefits in pt's POC.      Pt benefits from skilled PT. Anticipate pt will achieve remaining goals.     OBJECTIVE IMPAIRMENTS decreased activity tolerance, decreased coordination, decreased endurance, decreased mobility, difficulty walking, decreased ROM, decreased strength, decreased safety awareness, hypomobility, increased muscle spasms, impaired flexibility, improper body mechanics, postural dysfunction, and pain. scar restrictions  ACTIVITY LIMITATIONS  self-care,   home chores, work tasks    PARTICIPATION LIMITATIONS:  community, gym activities    PERSONAL FACTORS        are also affecting patient's functional outcome.    REHAB POTENTIAL: Good   CLINICAL DECISION MAKING: Evolving/moderate complexity   EVALUATION COMPLEXITY: Moderate    PATIENT EDUCATION:    Education details: Showed pt anatomy images. Explained muscles attachments/ connection, physiology of deep core system/ spinal- thoracic-pelvis-lower kinetic chain as they relate to pt's presentation, Sx, and past Hx. Explained what and how these areas of deficits need to be restored to balance and function    See Therapeutic activity / neuromuscular re-education section  Answered pt's questions.   Person educated: Patient Education method: Explanation, Demonstration, Tactile cues, Verbal cues, and Handouts Education comprehension: verbalized understanding, returned demonstration, verbal cues required, tactile cues required, and needs further education     PLAN: PT FREQUENCY: 1x/week   PT DURATION: 10 weeks   PLANNED INTERVENTIONS: Therapeutic exercises, Therapeutic activity, Neuromuscular re-education, Balance training, Gait training, Patient/Family  education, Self Care, Joint mobilization, Spinal mobilization, Moist heat, Taping, and Manual therapy, dry needling.   PLAN FOR NEXT SESSION: See clinical impression for plan     GOALS: Goals reviewed with patient? Yes  SHORT TERM GOALS: Target date:  09/10/2023    Pt will demo IND with HEP                    Baseline: Not IND            Goal status: MET    LONG TERM GOALS: Target date:01/22/24    1.Pt will demo proper deep core coordination without chest breathing and optimal excursion of diaphragm/pelvic floor in order to promote spinal stability and pelvic floor function  Baseline: dyscoordination Goal status: MET   2.  Pt will demo proper body mechanics in against gravity tasks and ADLs  work tasks, fitness  to minimize straining pelvic floor / back    Baseline: not IND, improper form that places strain on pelvic floor  Goal status: Ongoing ( 11/13/23: pt has not returned to [place] camp classes) ( 12/18/23:  upcoming sessions to focus on fitness modifications)     3. Pt will demo increased gait speed > 1.6 m/s with reciprocal gait pattern, longer stride length  in order to ambulate safely in community and return to fitness routine  Baseline: 1.48 m/s, limited R pelvic/ R shoulder  posterior rotation Goal status: MET ( 1.77 m/s, reciprocal)     4. Pt will demo levelled pelvic girdle and shoulder height in order to progress to deep core strengthening HEP and restore mobility at spine, pelvis, gait, posture minimize falls, and improve balance  Baseline: R shoulder / iliac crest lowered  Goal status:MET     5. Pt will return to gym activities with no L shoulder discomfort with compliance to realignment HEP and deep mm strengthening HEP across 1 week Goal status: Ongoing   6. Pt will report no leakage episode across 1 week  when yelling, coughing, sneezing Baseline: leakage  Goal status:  partially met   ( 11/13/23:  Pt reports it is less but still worries leakage will occur  with sneezing)  (12/18/23: not as frequent, and having to cross her legs)     Pia Lupe Plump, PT 12/25/2023, 8:16 AM

## 2023-12-31 ENCOUNTER — Ambulatory Visit: Attending: Obstetrics and Gynecology | Admitting: Physical Therapy

## 2023-12-31 DIAGNOSIS — M533 Sacrococcygeal disorders, not elsewhere classified: Secondary | ICD-10-CM | POA: Insufficient documentation

## 2023-12-31 DIAGNOSIS — R2689 Other abnormalities of gait and mobility: Secondary | ICD-10-CM | POA: Insufficient documentation

## 2023-12-31 DIAGNOSIS — R278 Other lack of coordination: Secondary | ICD-10-CM | POA: Diagnosis present

## 2023-12-31 NOTE — Patient Instructions (Signed)
 Stretch for pelvic floor   On belly: Riding horse edge of mattress  knee bent like riding a horse, move knee towards armpit and out  10 reps   Mermaid stretch  Rocking while seated on the floor with heels to one side of the hip Heels to one side of the hip  Rock forward towards the knee that is bent , rock beck towards the opposite sitting bones     Happy baby: Knees towards armpits  Breath 3-5 breaths, noticing pelvic floor relaxes     Alternating hamstring stretch from happy baby position One knee straightens, ankle 90 deg, toes point towards floor like you are walking on ceiling which stretches adductors and hamstrings    Repeat with other side

## 2023-12-31 NOTE — Therapy (Signed)
 OUTPATIENT PHYSICAL THERAPY TREATMENT  / Progress Note across 10 visits / RECERT    Patient Name: Pamela Griffin MRN: [id number] DOB:19-Aug-1985, 38 y.o., female Today's Date: 12/31/2023    PT End of Session - 12/31/23 0858     Visit Number 10    Number of Visits 26    Date for PT Re-Evaluation 03/10/24    Authorization Type Medicaid 11 visits between 2/19 - 11/17/23    Authorization - Number of Visits 11    PT Start Time 0845    PT Stop Time 0930    PT Time Calculation (min) 45 min    Activity Tolerance Patient tolerated treatment well;No increased pain    Behavior During Therapy WFL for tasks assessed/performed          Past Medical History:  Diagnosis Date   FAP (familial adenomatous polyposis)    Hypothyroidism    Past Surgical History:  Procedure Laterality Date   ECTOPIC PREGNANCY SURGERY  2015   HAND SURGERY  2003   L fallopian tube removed      MANDIBLE FRACTURE SURGERY  2010   OVARIAN CYST REMOVAL Left 2014   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   PCP:   REFERRING PROVIDER: Alois Ozell BIRCH   REFERRING DIAG: R32 (ICD-10-CM) - Unspecified urinary incontinence  Rationale for Evaluation and Treatment Rehabilitation  THERAPY DIAG:  Other abnormalities of gait and mobility  Sacrococcygeal disorders, not elsewhere classified  Other lack of coordination  ONSET DATE:   SUBJECTIVE:       SUBJECTIVE STATEMENT  TODAY  Pt reports she has been practicing her HEP. Leakage still occurs   SUBJECTIVE STATEMENT  EVAL  08/13/23:  1)  SUI:  laughing, coughing, yelling, working out    Denied urge incontinence  Daily BMs, denied straining, 70 % of the time Type 4 stool consistency , 30% of time Type 3  90-100 fl oz of water   2) L shoulder discomfort when  increasing weight or too many reps .  Pt had shoulder injury in 2024 due to incident in the gym with either too much weight or too many reps. Pt finished PT for 2-3 month for L shoulder pain.  NO more pain just discomfort at the head of shoulder socket.  No numbness / tingling.   Pt just to workout 6 days week, now is 3-4 week [facility].  Includes sit up and crunches,  No consistent stretches . 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   PLOF: IND   PATIENT GOALS:  Getting pelvic floor strengthened so she can jump,  sneeze and not leakage    OBJECTIVE:      Pelvic Floor Special Questions - 12/31/23 1825     Pelvic Floor Internal Exam pt consented verbally with no contraindications    Exam Type Vaginal    Palpation tightness and tenderness at B obt int and puborectalis at pubic attachments R > L  , tendency to be in posteror tilt of pelvic floor, limited lengthening of anterior mm,          [facility] Adult PT Treatment/Exercise - 12/31/23 1826       Neuro Re-ed    Neuro Re-ed Details  cued for anterior pelvic tilt to lengthen pelvic floor mm, cued for pelvic floor stretches to optimize lengthening of pelvic floor      Manual Therapy   Internal Pelvic Floor STM/MWM at problem areas noted in assessment to promote lenghtening of pelvic floor                HOME EXERCISE PROGRAM: See pt instruction section     ASSESSMENT:  CLINICAL IMPRESSION:  Pt has met three  goals with structural alignment but still requires skilled PT for remaining goals.   Shoulder and pelvis and spine have regained central alignment.  Pt has regained shoulder ROM with reach behind back and abduction.   Pt has stopped sleeping on her belly which is helping with her maintaining equal alignment of shoulder and pelvis. She is able to sleep on her back throughout the nights more often.  Lower kinetic chain is improving to address adducted knees, hip IR,  and supination / adducted feet and tendency to hyperextended knees which impact fitness positions such as squats and balance and impacting overuse of pelvic floor related incontinence.  Pt required cues for transverse arch coactivation/ heel, unlocked knees, in SLS, cued for propoioception of knee and feet, pelvis in squats and  alignment,  cued for sit to stand technique to minimize overactive pelvic floor.  Cued for squats in fi tness and how to engage in [facility] and alignment to minimize overactivity pelvic floor and minimize back strain.    These improvements will help promote optimize IAP system for improved pelvic floor function, trunk stability, gait, balance, stabilization with mobility tasks.   Pt's structural alignment has improved  which will help with longer lasting benefits.  L shoulder pain improved but pt has not returned to fitness classes yet; requiring education to modify [facility] classes to minimize relapse of L shoulder and urinary leakage.  Urinary incontinence still requires skilled PT.   Performed internal pelvic floor assessment today which showed tightness and tenderness and B obt int and puborectalis mm at pubic attachments R > L  , tendency to be in posteror tilt of pelvic floor, limited lengthening of anterior mm . These deficits require skilled PT to address to minimize incontinence.    Pt demo'd improve mobility and lengthening of pelvic floor  post Tx which will help with continence.      Plan to add more fitness training and modifications and propioception training in upcoming sessions to minmize relapse of LBP, and further improve incontinence. Pt requested a letter to her gym to pause her membership for Burn [facility] classes for another 3 months because pt would like to continue with Pelvic PT and not perform the exercises in those classes to improve continence.       Regional interdependent approaches will yield greater benefits in pt's POC.     Pt remains motivated  and compliant with HEP. Pt had to cancel a few appts due to family getting sick and children's school schedules.    Pt benefits from skilled PT. Anticipate pt will achieve remaining goals.     OBJECTIVE IMPAIRMENTS decreased activity tolerance, decreased coordination, decreased endurance, decreased mobility, difficulty walking, decreased ROM, decreased strength, decreased safety awareness, hypomobility, increased muscle spasms, impaired flexibility, improper body mechanics, postural dysfunction, and pain. scar restrictions   ACTIVITY LIMITATIONS  self-care,   home chores, work tasks    PARTICIPATION LIMITATIONS:  community, gym activities    PERSONAL FACTORS        are also affecting patient's functional outcome.    REHAB POTENTIAL: Good   CLINICAL DECISION MAKING: Evolving/moderate complexity   EVALUATION COMPLEXITY: Moderate    PATIENT EDUCATION:    Education details: Showed pt anatomy images. Explained muscles attachments/ connection, physiology of deep core system/ spinal- thoracic-pelvis-lower kinetic chain as they relate to pt's presentation, Sx, and past Hx. Explained what and how these areas of deficits need to be restored to balance and function    See Therapeutic activity / neuromuscular re-education section  Answered pt's questions.   Person educated: Patient Education method: Explanation, Demonstration, Tactile cues, Verbal cues, and  Handouts Education comprehension: verbalized understanding, returned demonstration, verbal cues required, tactile cues required, and needs further education     PLAN: PT FREQUENCY: 1x/week   PT DURATION: 10 weeks   PLANNED INTERVENTIONS: Therapeutic exercises, Therapeutic activity, Neuromuscular re-education, Balance training, Gait training, Patient/Family education, Self Care, Joint mobilization, Spinal mobilization, Moist heat, Taping, and Manual therapy, dry needling.   PLAN FOR NEXT SESSION: See clinical impression for plan     GOALS: Goals reviewed with patient? Yes  SHORT TERM GOALS: Target date:  09/10/2023    Pt will demo IND with HEP                    Baseline: Not IND            Goal status: MET    LONG TERM GOALS: Target date:03/10/2024      1.Pt will demo proper deep core coordination without chest breathing and optimal excursion of diaphragm/pelvic floor in order to promote spinal stability and pelvic floor function  Baseline: dyscoordination Goal status: MET   2.  Pt will demo proper body mechanics in against gravity tasks and ADLs  work tasks, fitness  to minimize straining pelvic floor / back    Baseline: not IND, improper form that places strain on pelvic floor  Goal status: Ongoing ( 11/13/23: pt has not returned to [place] camp classes) ( 12/18/23/ 12/31/23   upcoming sessions to focus on fitness modifications)     3. Pt will demo increased gait speed > 1.6 m/s with reciprocal gait pattern, longer stride length  in order to ambulate safely in community and return to fitness routine  Baseline: 1.48 m/s, limited R pelvic/ R shoulder  posterior rotation Goal status: MET ( 1.77 m/s, reciprocal)     4. Pt will demo levelled pelvic girdle and shoulder height in order to progress to deep core strengthening HEP and restore mobility at spine, pelvis, gait, posture minimize falls, and improve balance  Baseline: R shoulder / iliac crest lowered  Goal status:MET      5. Pt will return to gym activities with no L shoulder discomfort with compliance to realignment HEP and deep mm strengthening HEP across 1 week Goal status: Ongoing   6. Pt will report no  leakage episode across 1 week  when yelling, coughing, sneezing Baseline: leakage  Goal status:  partially met   ( 11/13/23:  Pt reports it is less but still worries leakage will occur with sneezing)  (12/18/23/ 12/31/23 :   not as frequent, and not needing to to cross her legs to prevent leakage )     Pia Lupe Plump, PT 12/31/2023, 9:00 AM

## 2024-01-05 ENCOUNTER — Ambulatory Visit: Admitting: Physical Therapy

## 2024-01-13 ENCOUNTER — Ambulatory Visit: Admitting: Physical Therapy

## 2024-01-19 ENCOUNTER — Ambulatory Visit: Admitting: Physical Therapy

## 2024-01-19 DIAGNOSIS — R2689 Other abnormalities of gait and mobility: Secondary | ICD-10-CM

## 2024-01-19 DIAGNOSIS — R278 Other lack of coordination: Secondary | ICD-10-CM

## 2024-01-19 DIAGNOSIS — M533 Sacrococcygeal disorders, not elsewhere classified: Secondary | ICD-10-CM

## 2024-01-19 NOTE — Therapy (Addendum)
 OUTPATIENT PHYSICAL THERAPY TREATMENT    Patient Name: Pamela Griffin MRN: [id number] DOB:Nov 22, 1985, 38 y.o., female Today's Date: 01/19/2024    PT End of Session - 01/19/24 1155     Visit Number 11    Number of Visits 26    Date for PT Re-Evaluation 03/10/24    Authorization Type Medicaid 11 visits between 2/19 - 11/17/23    Authorization - Number of Visits 11    PT Start Time 1150    PT Stop Time 1230    PT Time Calculation (min) 40 min    Activity Tolerance Patient tolerated treatment well;No increased pain    Behavior During Therapy WFL for tasks assessed/performed          Past Medical History:  Diagnosis Date   FAP (familial adenomatous polyposis)    Hypothyroidism    Past Surgical History:  Procedure Laterality Date   ECTOPIC PREGNANCY SURGERY  2015   HAND SURGERY  2003   L fallopian tube removed      MANDIBLE FRACTURE SURGERY  2010   OVARIAN CYST REMOVAL Left 2014   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   PCP:   REFERRING PROVIDER: Alois Ozell BIRCH   REFERRING DIAG: R32 (ICD-10-CM) - Unspecified urinary incontinence  Rationale for Evaluation and Treatment Rehabilitation  THERAPY DIAG:  Other abnormalities of gait and mobility  Sacrococcygeal disorders, not elsewhere classified  Other lack of coordination  ONSET DATE:   SUBJECTIVE:       SUBJECTIVE STATEMENT  TODAY  Pt reports no issues after last internal pelvic floor assessment and Tx  SUBJECTIVE STATEMENT  EVAL  08/13/23:  1)  SUI:  laughing, coughing, yelling, working out    Denied urge incontinence  Daily BMs, denied straining, 70 % of the time Type 4 stool consistency , 30% of time Type 3  90-100 fl oz of water   2) L shoulder discomfort when increasing weight or too many reps .  Pt  had shoulder injury in 2024 due to incident in the gym with either too much weight or too many reps. Pt finished PT for 2-3 month for L shoulder pain.  NO more pain just discomfort at the head of shoulder socket.  No numbness / tingling.   Pt just to workout 6 days week, now is 3-4 week [facility].  Includes sit up and crunches,  No consistent stretches . 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   PLOF: IND   PATIENT GOALS:  Getting pelvic floor strengthened so she can jump, sneeze and not leakage    OBJECTIVE:  Pelvic Floor Special Questions - 01/19/24 1348     Pelvic Floor Internal Exam pt consented verbally with no contraindications    Exam Type Vaginal    Palpation tightness and tenderness at B obt int and puborectalis at pubic attachments R > L  , tendency to be in posteror tilt of pelvic floor, limited lengthening of anterior mm L > R, urethra compressae, puborectalis attachment, tightness over pubic symphysis           [facility] PT Assessment - 01/19/24 1424       Coordination   Coordination and Movement Description poor prioception of pelvis, tendency for posterior tilt           [facility] Adult PT Treatment/Exercise - 01/19/24 1424       Therapeutic Activites    Other Therapeutic Activities guided relaxation with anteiror tilt of pelvis, cued for relaxation of lowered urethra and downward pushing with  overuse of abs and poor feet propioception      Neuro Re-ed    Neuro Re-ed Details  excessive cues for pelvic tilts into anterior position with no extensor/ ab overuse , cued for lengthening of pelvic floor with sequential/ circumferential upward movement of pelvic floor      Manual Therapy   Internal Pelvic Floor STM/MWM at problem areas noted in assessment to promote lenghtening of pelvic floor               HOME EXERCISE PROGRAM: See pt instruction section    ASSESSMENT:  CLINICAL IMPRESSION:  Pt has made the following improvements:   Shoulder and pelvis and spine have regained central alignment.  Pt has regained shoulder ROM with reach behind back and abduction.   Pt has stopped sleeping on her belly which is helping with her maintaining equal alignment of shoulder and pelvis. She is able to sleep on her back throughout the nights more often.  Lower kinetic chain is improving to address adducted knees, hip IR,  and supination / adducted feet and tendency to hyperextended knees which impact fitness positions such as squats and balance and impacting overuse of pelvic floor related incontinence.  Pt required cues for transverse arch coactivation/ heel, unlocked knees, in SLS, cued for propoioception of knee and feet, pelvis in squats and  alignment,  cued for sit to stand technique to minimize overactive pelvic floor.  Cued for squats in fi tness and how to engage in [facility] and alignment to minimize overactivity pelvic floor and minimize back strain.     Continued to perform internal pelvic floor assessment today which showed tightness and tenderness pubic attachments and puborectalis, urethra compressae, puborectalis attachment, tightness over pubic symphysis.  Provided excessive cues for pelvic tilts into anterior position with no extensor/ ab overuse , cued for lengthening of pelvic floor with sequential/ circumferential upward movement of pelvic floor. Pt demo'd improved body  awareness and upward position of urethra post Tx.    These deficits require skilled PT to address to minimize incontinence.     Plan to add more fitness training and modifications and propioception training in upcoming sessions to minmize relapse of LBP, and further improve incontinence.     Regional interdependent approaches will yield greater benefits in pt's POC.     Pt remains motivated and compliant with HEP. Pt had to cancel a few appts due to family getting sick and children's school schedules.    Pt benefits from skilled PT. Anticipate pt will achieve remaining goals.     OBJECTIVE IMPAIRMENTS  decreased activity tolerance, decreased coordination, decreased endurance, decreased mobility, difficulty walking, decreased ROM, decreased strength, decreased safety awareness, hypomobility, increased muscle spasms, impaired flexibility, improper body mechanics, postural dysfunction, and pain. scar restrictions   ACTIVITY LIMITATIONS  self-care,   home chores, work tasks    PARTICIPATION LIMITATIONS:  community, gym activities    PERSONAL FACTORS        are also affecting patient's functional outcome.    REHAB POTENTIAL: Good   CLINICAL DECISION MAKING: Evolving/moderate complexity   EVALUATION COMPLEXITY: Moderate    PATIENT EDUCATION:    Education details: Showed pt anatomy images. Explained muscles attachments/ connection, physiology of deep core system/ spinal- thoracic-pelvis-lower kinetic chain as they relate to pt's presentation, Sx, and past Hx. Explained what and how these areas of deficits need to be restored to balance and function    See Therapeutic activity / neuromuscular re-education section  Answered pt's questions.   Person educated: Patient Education method: Explanation, Demonstration, Tactile cues, Verbal cues, and Handouts Education comprehension: verbalized understanding, returned demonstration, verbal cues required, tactile cues required, and needs further  education     PLAN: PT FREQUENCY: 1x/week   PT DURATION: 10 weeks   PLANNED INTERVENTIONS: Therapeutic exercises, Therapeutic activity, Neuromuscular re-education, Balance training, Gait training, Patient/Family education, Self Care, Joint mobilization, Spinal mobilization, Moist heat, Taping, and Manual therapy, dry needling.   PLAN FOR NEXT SESSION: See clinical impression for plan     GOALS: Goals reviewed with patient? Yes  SHORT TERM GOALS: Target date:  09/10/2023    Pt will demo IND with HEP                    Baseline: Not IND            Goal status: MET    LONG TERM GOALS: Target date:03/10/2024      1.Pt will demo proper deep core coordination without chest breathing and optimal excursion of diaphragm/pelvic floor in order to promote spinal stability and pelvic floor function  Baseline: dyscoordination Goal status: MET   2.  Pt will demo proper body mechanics in against gravity tasks and ADLs  work tasks, fitness  to minimize straining pelvic floor / back    Baseline: not IND, improper form that places strain on pelvic floor  Goal status: Ongoing ( 11/13/23: pt has not returned to [place] camp classes) ( 12/18/23/ 12/31/23   upcoming sessions to focus on fitness modifications)     3. Pt will demo increased gait speed > 1.6 m/s with reciprocal gait pattern, longer stride length  in order to ambulate safely in community and return to fitness routine  Baseline: 1.48 m/s, limited R pelvic/ R shoulder  posterior rotation Goal status: MET ( 1.77 m/s, reciprocal)     4. Pt will demo levelled pelvic girdle and shoulder height in order to progress to deep core strengthening HEP and restore mobility at spine, pelvis, gait, posture minimize falls, and improve balance  Baseline: R shoulder / iliac crest lowered  Goal status:MET     5. Pt will return to gym activities with no L shoulder discomfort with compliance to realignment HEP and deep mm strengthening HEP across 1  week Goal status: Ongoing   6. Pt will report no leakage episode across 1 week  when yelling, coughing, sneezing Baseline: leakage  Goal status:  partially met   ( 11/13/23:  Pt reports it is less but still worries leakage will occur with sneezing)  (12/18/23/ 12/31/23 :  not as frequent, and not needing to to cross her legs to prevent leakage )     Pia Lupe Plump, PT 01/19/2024, 11:55 AM

## 2024-01-29 ENCOUNTER — Ambulatory Visit: Admitting: Physical Therapy

## 2024-01-29 DIAGNOSIS — R278 Other lack of coordination: Secondary | ICD-10-CM

## 2024-01-29 DIAGNOSIS — R2689 Other abnormalities of gait and mobility: Secondary | ICD-10-CM | POA: Diagnosis not present

## 2024-01-29 DIAGNOSIS — M533 Sacrococcygeal disorders, not elsewhere classified: Secondary | ICD-10-CM

## 2024-01-29 NOTE — Therapy (Addendum)
 OUTPATIENT PHYSICAL THERAPY TREATMENT  /  Patient Name: Pamela Griffin MRN: [id number] DOB:01/21/1986, 38 y.o., female Today's Date: 01/29/2024    PT End of Session - 01/29/24 1514     Visit Number 12    Number of Visits 26    Date for PT Re-Evaluation 03/10/24    Authorization Type Medicaid 4 between 7/7 through 8/5    Authorization - Visit Number 3    Authorization - Number of Visits 4    PT Start Time 1510    PT Stop Time 1550    PT Time Calculation (min) 40 min    Activity Tolerance Patient tolerated treatment well;No increased pain    Behavior During Therapy WFL for tasks assessed/performed          Past Medical History:  Diagnosis Date   FAP (familial adenomatous polyposis)    Hypothyroidism    Past Surgical History:  Procedure Laterality Date   ECTOPIC PREGNANCY SURGERY  2015   HAND SURGERY  2003   L fallopian tube removed      MANDIBLE FRACTURE SURGERY  2010   OVARIAN CYST REMOVAL Left 2014   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   PCP:   REFERRING PROVIDER: Alois Ozell BIRCH   REFERRING DIAG: R32 (ICD-10-CM) - Unspecified urinary incontinence  Rationale for Evaluation and Treatment Rehabilitation  THERAPY DIAG:  Other abnormalities of gait and mobility  Sacrococcygeal disorders, not elsewhere classified  Other lack of coordination  ONSET DATE:   SUBJECTIVE:       SUBJECTIVE STATEMENT  TODAY  Pt reports no leakage last week. She was anitcipating leakage with sneezing    SUBJECTIVE STATEMENT  EVAL  08/13/23:  1)  SUI:  laughing, coughing, yelling, working out    Denied urge incontinence  Daily BMs, denied straining, 70 % of the time Type 4 stool consistency , 30% of time Type 3  90-100 fl oz of water   2) L shoulder discomfort when increasing  weight or too many reps .  Pt had shoulder injury in 2024 due to incident in the gym with either too much weight or too many reps. Pt finished PT for 2-3 month for L shoulder pain.  NO more pain just discomfort at the head of shoulder socket.  No numbness / tingling.   Pt just to workout 6 days week, now is 3-4 week [facility].  Includes sit up and crunches,  No consistent stretches . 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   PLOF: IND   PATIENT GOALS:  Getting pelvic floor strengthened so she can jump,  sneeze and not leakage    OBJECTIVE:      [facility] PT Assessment -       Squat   Comments more WBing on heels          Pelvic Floor Special Questions     Pelvic Floor Internal Exam pt consented verbally with no contraindications    Exam Type Vaginal    Palpation no more tightness and tenderness of pelvic floor, proper lenghtening of pelvic floor and sequential/ circumferential activation of pelvic floor all 3 layers            [facility] Adult PT Treatment/Exercise       Therapeutic Activites    Other Therapeutic Activities modified fitness routine with theraband for resistance training , explained alignment and technique to strengthen posterior chain and deep core without overuse of upper trap      Neuro Re-ed    Neuro Re-ed Details  excessive cues for fitness exercises with resistance band for bicep curls/  squats and tricep curls in lunge position, lat pull with scapular retraction to furhter minimize rounded shoulders and proper quat              HOME EXERCISE PROGRAM: See pt instruction section    ASSESSMENT:  CLINICAL IMPRESSION:  Pt has made the following improvements:   Shoulder and pelvis and spine have regained central alignment.  Pt has regained shoulder ROM with reach behind back and abduction.   Pt has stopped sleeping on her belly which is helping with her maintaining equal alignment of shoulder and pelvis. She is able to sleep on her back throughout the nights more often.  Lower kinetic chain is improving to address adducted knees, hip IR,  and supination / adducted feet and tendency to hyperextended knees which impact fitness positions such as squats and balance and impacting overuse of pelvic floor related incontinence.  Pt required cues for transverse arch coactivation/ heel, unlocked knees, in SLS, cued for propoioception of knee and feet, pelvis in squats and  alignment,  cued for sit to stand technique to minimize overactive pelvic floor.  Cued for squats in fi tness and how to engage in [facility] and alignment to minimize overactivity pelvic floor and minimize back strain.  Pt has had no leakage with sneezing across 1 week      Continued to perform internal pelvic floor assessment today which showed no more  tightness and tenderness of mm and showed increased ability of sequential/ circumferential upward movement of pelvic floor with less cues   Advanced to modified fitness routine with theraband for resistance training , explained alignment and technique to strengthen posterior chain and deep core without overuse of upper trap. Pt required cues for alignment and technique.    These deficits require skilled PT to address to minimize incontinence.     Plan to add more fitness training and modifications and propioception training in upcoming sessions to minmize relapse  of LBP, and further improve incontinence.     Regional interdependent approaches will yield greater benefits in pt's POC.     Pt remains motivated and compliant with HEP. Pt had to cancel a few appts due to family getting sick and children's school schedules.    Pt benefits from skilled PT. Anticipate pt will achieve remaining goals.     OBJECTIVE IMPAIRMENTS decreased activity tolerance, decreased coordination, decreased endurance, decreased mobility, difficulty walking, decreased ROM, decreased strength, decreased safety awareness, hypomobility, increased muscle spasms, impaired flexibility, improper body mechanics, postural dysfunction,  and pain. scar restrictions   ACTIVITY LIMITATIONS  self-care,   home chores, work tasks    PARTICIPATION LIMITATIONS:  community, gym activities    PERSONAL FACTORS        are also affecting patient's functional outcome.    REHAB POTENTIAL: Good   CLINICAL DECISION MAKING: Evolving/moderate complexity   EVALUATION COMPLEXITY: Moderate    PATIENT EDUCATION:    Education details: Showed pt anatomy images. Explained muscles attachments/ connection, physiology of deep core system/ spinal- thoracic-pelvis-lower kinetic chain as they relate to pt's presentation, Sx, and past Hx. Explained what and how these areas of deficits need to be restored to balance and function    See Therapeutic activity / neuromuscular re-education section  Answered pt's questions.   Person educated: Patient Education method: Explanation, Demonstration, Tactile cues, Verbal cues, and Handouts Education comprehension: verbalized understanding, returned demonstration, verbal cues required, tactile cues required, and needs further education     PLAN: PT FREQUENCY: 1x/week   PT DURATION: 10 weeks   PLANNED INTERVENTIONS: Therapeutic exercises, Therapeutic activity, Neuromuscular re-education, Balance training, Gait training, Patient/Family education, Self Care, Joint  mobilization, Spinal mobilization, Moist heat, Taping, and Manual therapy, dry needling.   PLAN FOR NEXT SESSION: See clinical impression for plan     GOALS: Goals reviewed with patient? Yes  SHORT TERM GOALS: Target date:  09/10/2023    Pt will demo IND with HEP                    Baseline: Not IND            Goal status: MET    LONG TERM GOALS: Target date:03/10/2024      1.Pt will demo proper deep core coordination without chest breathing and optimal excursion of diaphragm/pelvic floor in order to promote spinal stability and pelvic floor function  Baseline: dyscoordination Goal status: MET   2.  Pt will demo proper body mechanics in against gravity tasks and ADLs  work tasks, fitness  to minimize straining pelvic floor / back    Baseline: not IND, improper form that places strain on pelvic floor  Goal status: Ongoing ( 11/13/23: pt has not returned to [place] camp classes) ( 12/18/23/ 12/31/23   upcoming sessions to focus on fitness modifications)     3. Pt will demo increased gait speed > 1.6 m/s with reciprocal gait pattern, longer stride length  in order to ambulate safely in community and return to fitness routine  Baseline: 1.48 m/s, limited R pelvic/ R shoulder  posterior rotation Goal status: MET ( 1.77 m/s, reciprocal)     4. Pt will demo levelled pelvic girdle and shoulder height in order to progress to deep core strengthening HEP and restore mobility at spine, pelvis, gait, posture minimize falls, and improve balance  Baseline: R shoulder / iliac crest lowered  Goal status:MET     5. Pt will return to gym activities with no L shoulder discomfort with compliance to realignment HEP and deep mm strengthening HEP across 1 week Goal status: Ongoing   6. Pt will report no leakage episode across 1 week  when yelling, coughing, sneezing Baseline: leakage  Goal status:  partially met   ( 11/13/23:  Pt reports it is less but still worries leakage will occur with sneezing)   (12/18/23/ 12/31/23 :   not as frequent, and not needing to to cross her legs to prevent leakage )     Pia Lupe Plump, PT 01/29/2024, 3:17 PM

## 2024-02-03 ENCOUNTER — Ambulatory Visit: Admitting: Physical Therapy

## 2024-02-05 ENCOUNTER — Ambulatory Visit: Admitting: Physical Therapy
